# Patient Record
Sex: Female | Born: 1990 | Race: White | Hispanic: No | Marital: Single | Smoking: Never smoker
Health system: Southern US, Community
[De-identification: ages and names within clinical notes are randomized; demographics above are authoritative.]

## PROBLEM LIST (undated history)

## (undated) HISTORY — PX: BREAST ENHANCEMENT SURGERY: SHX7

---
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
## 2008-08-24 IMAGING — CT CT PELVIS W/ CM
1 of 3 series · 14 of 32 positions shown, 19 images · non-contrast
Comparison: none

HISTORY: Abdominal pain, bloating, constipation

[Series 2: abd_pel 5.0 b40f · axial · 0.56mm/px · z∈[-456,-82]mm · 14 of 87 slices shown, 19 images]
[im 6/87  soft-tissue]
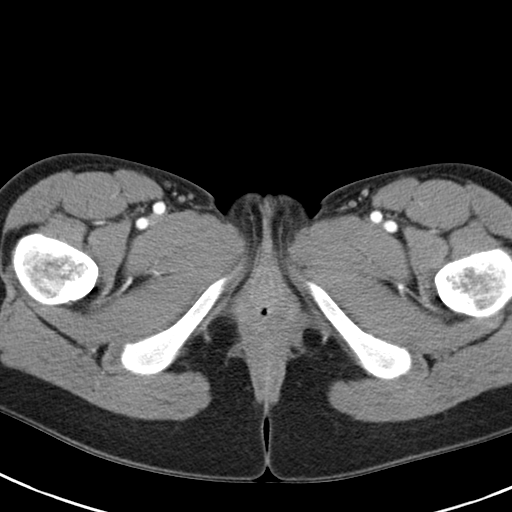
[im 6/87  bone]
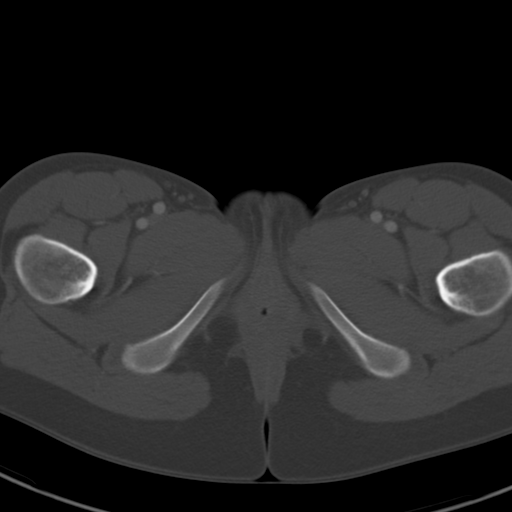
[im 11/87  soft-tissue]
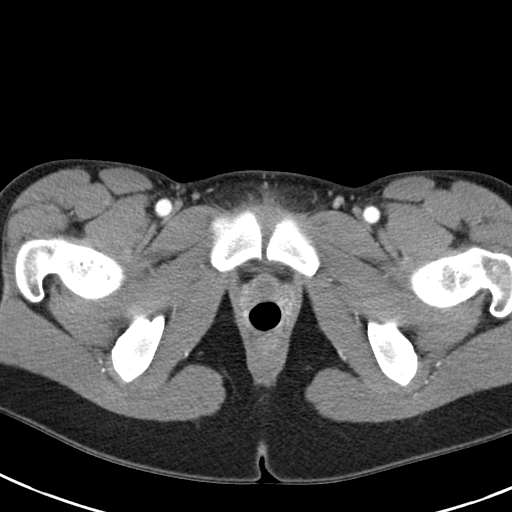
[im 21/87  soft-tissue]
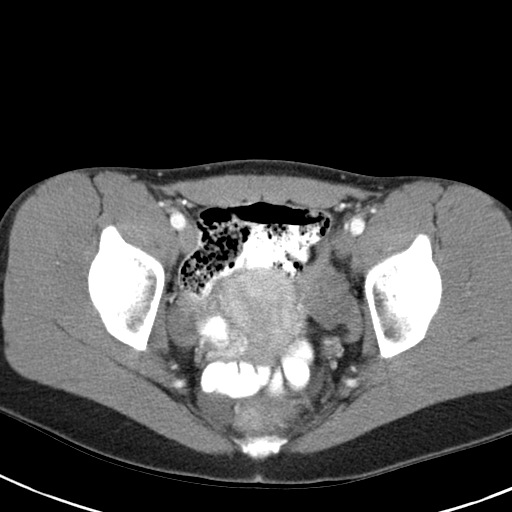
[im 26/87  soft-tissue]
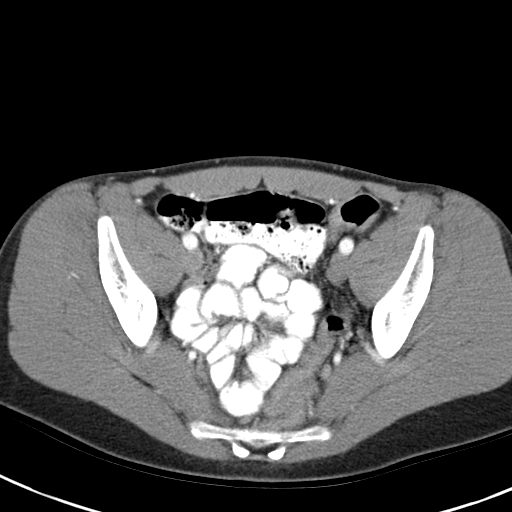
[im 31/87  soft-tissue]
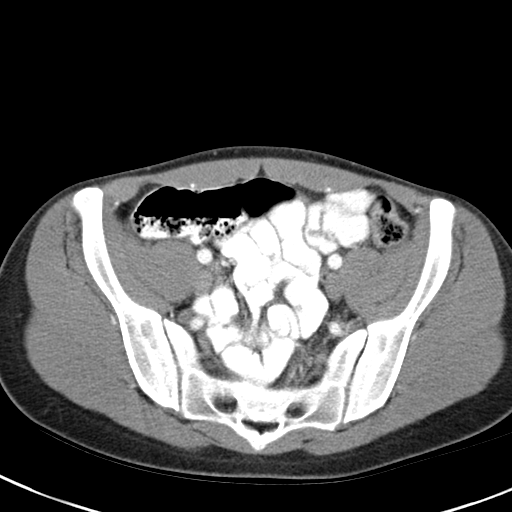
[im 36/87  soft-tissue]
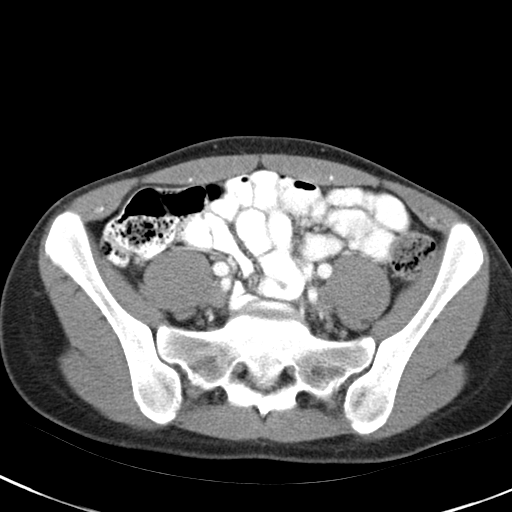
[im 46/87  soft-tissue]
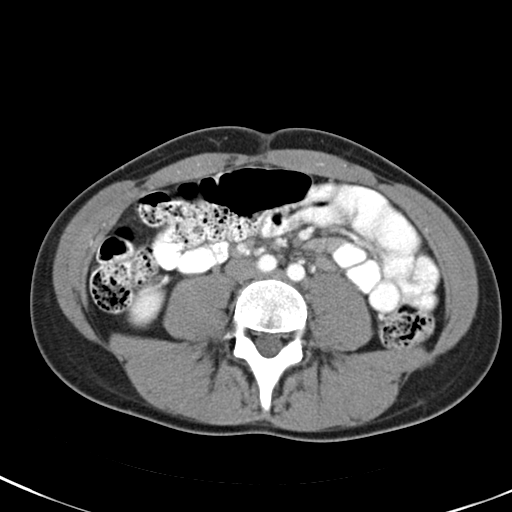
[im 51/87  soft-tissue]
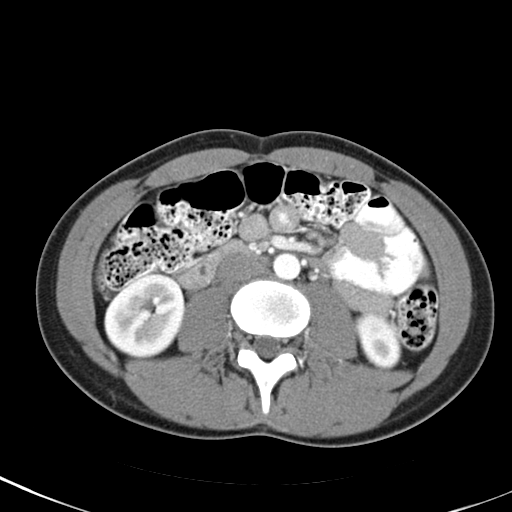
[im 56/87  soft-tissue]
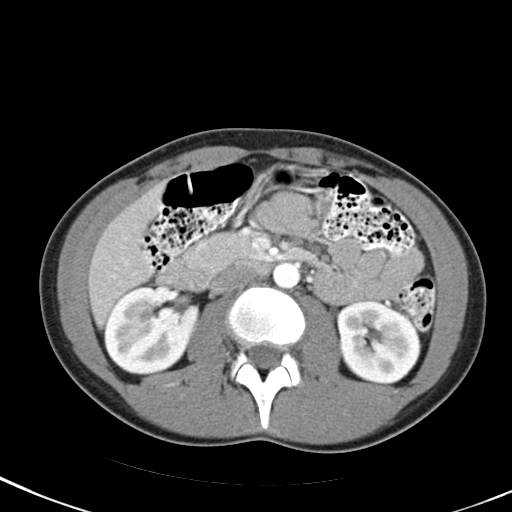
[im 56/87  bone]
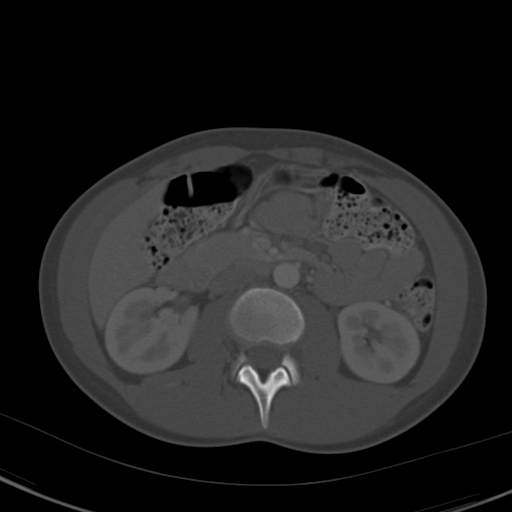
[im 61/87  soft-tissue]
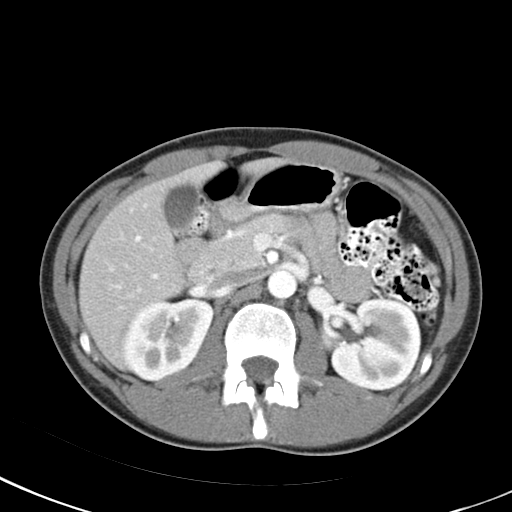
[im 66/87  soft-tissue]
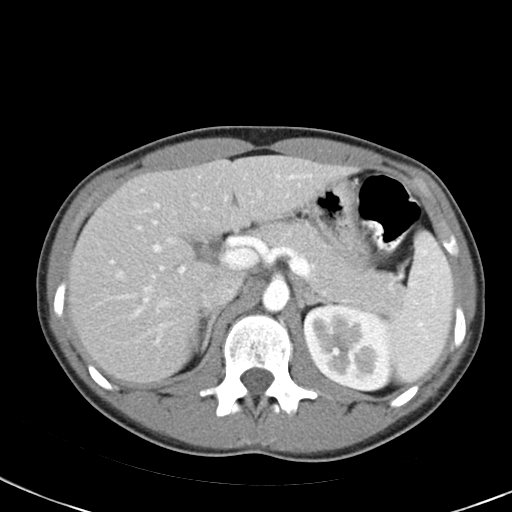
[im 66/87  lung]
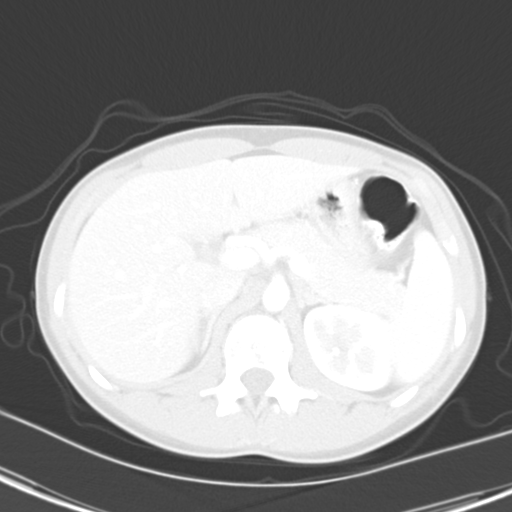
[im 71/87  lung]
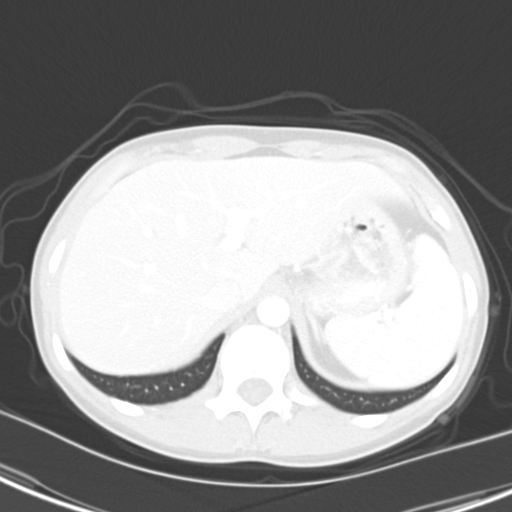
[im 76/87  soft-tissue]
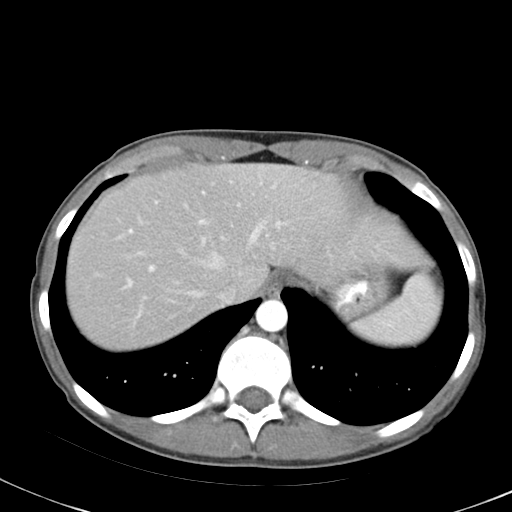
[im 76/87  lung]
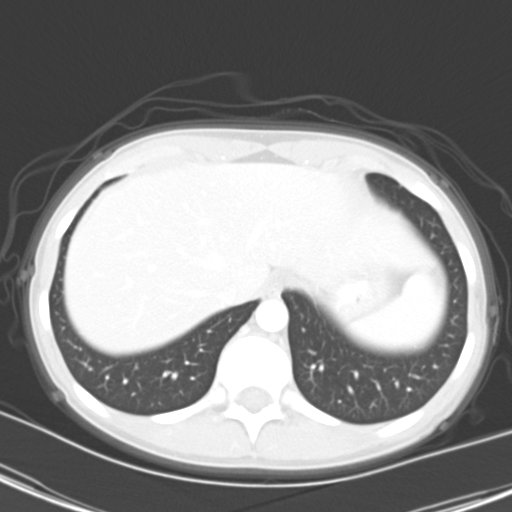
[im 81/87  soft-tissue]
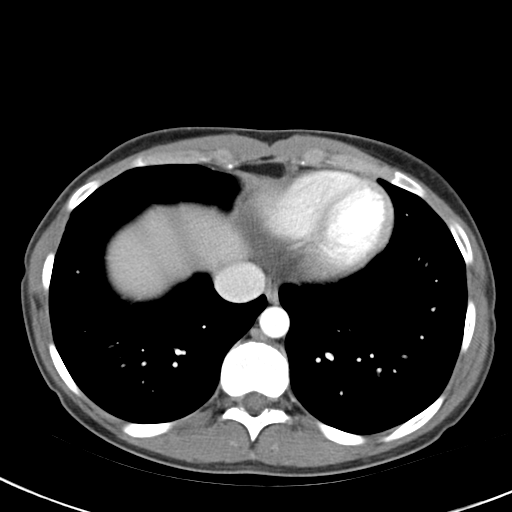
[im 81/87  lung]
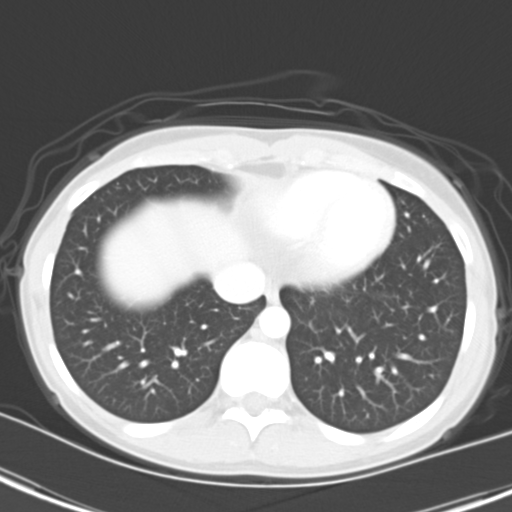

[14 of 32 positions shown; findings below may reference images not displayed]

CT ABDOMEN AND PELVIS WITH CONTRAST:

Multidetector helical CT imaging abdomen and pelvis performed.
Sagittal and coronal images are reconstructed from the axial data set.
Exam utilized dilute oral contrast and 100 cc Imnipaque-G77.
No prior exam for comparison.

CT ABDOMEN:

Lung bases clear.
Liver, spleen, pancreas, kidneys, and adrenal glands normal.
Stomach and upper abdominal bowel loops unremarkable.
No mass, adenopathy, free fluid, or inflammatory process.
IMPRESSION: No acute upper abdominal abnormalities.

CT PELVIS:

Small amount of nonspecific free pelvic fluid.
Tampon in vagina.
Large and small bowel loops normal.
Unremarkable uterus.
Questionable small exophytic cyst the right ovary, 12 mm greatest size image 69.
Bladder and distal ureters unremarkable.
No pelvic mass, adenopathy, or inflammatory process.
Bones unremarkable.
Low-lying cecum, deep in pelvis.
IMPRESSION: Small amount nonspecific free pelvic fluid.
Question tiny 12 mm diameter right ovarian cyst.
Otherwise negative CT pelvis.

## 2009-06-02 ENCOUNTER — Emergency Department (HOSPITAL_COMMUNITY): Admission: EM | Admit: 2009-06-02 | Discharge: 2009-06-02 | Payer: Self-pay | Admitting: Emergency Medicine

## 2010-07-21 NOTE — Assessment & Plan Note (Signed)
Hannah [name], KOOYMAN                  CHART#:  [id number]   DATE:  09/18/2007                       DOB:  [birth year]   CHIEF COMPLAINT:  Abdominal pain, gas pain.   SUBJECTIVE:  The patient is here for a followup visit.  She was last  seen on 07/12/2007.  She has a history of constipation, abdominal  cramping, and bloating, that we initially saw her for back in February  2009.  At that time, she had a CT of the abdomen and pelvis, which  revealed a possible 12-mm right ovarian cyst, otherwise negative.  Labs  revealed normal CBC, CMET, and lipase.  TSH was also normal.  Hemoccults  were negative.  When she saw Dr. Jena Gauss in May, she was doing somewhat  better.  Her constipation seemed to have settled down.  She is having  more regular bowel movements.  It was felt that no further evaluation  was necessary at that time.  She states now, she has had recurrence of  symptoms.  They really have been there the whole time and seemed to be  worse now.  She is particularly complaining of lower abdominal pain,  which she describes as bloating and gas-type pain.  She has a lot of  flatulence.  She is having 3 and 4 stools every morning.  She also has  one, usually after a evening meal.  She really has not noticed any  correlation with any of her dietary intake.  She does consume salads  regularly as well as some cooked vegetables in the evenings.  She does  not really have a lot of fruit.  She has Special K or Fiber One Bars in  the morning for breakfast.  She noticed that the Digestive Advantage  tablets we gave her back in February seemed to make all of these  symptoms worse.  She never tried the HyoMax.  She denies any blood in  the stool or melena.  Denies any nausea, vomiting, heartburn, dysphagia,  or odynophagia.  Her weight is down 3 pounds since we last saw her.   MEDICATIONS:  1. Adderall 10mg  PO daily.  2. Birth Control Pill PO daily.   ALLERGIES:  NKDA   PHYSICAL EXAM:  Wt:  117   Ht: 5'6  Temp: 97.6  BP:  98/72  Pulse:  76  General:  Pleasant, well-nourished, well-developed WF in NAD.  Skin:  Warm, dry.  No jaundice.  HEENT:  Sclera nonicteric.  OP moist, pink.  Chest:  CTA bilaterally.  RRR.  Abd:  Positive bowel sound.  Soft.  Minimal tenderness in the lower  abdomen.  No distention.  No rebound or guarding.  No masses or HSM.  Lower ext:  No edema.   IMPRESSION:  Hannah [name] is an 20 y/o lady with abdominal bloating and lower  abdominal discomfort with change in bowel movements.  She was seen  previously for constipation but now she is having several formed bowel  movements daily.  Her most prominent complaint is of bloating and  discomfort.  I suspect her symptoms are secondary to IBS.  Ddx includes  Celiac disease and IBD.   PLAN:  1. ESR, Celiac Ab Panel.  2. Trial of Levsin 0.125mg  SL qac and qhs prn #90 no refills.  3. IBS literature provided.  4.  Discuss further with Dr. Jena Gauss once labs available.       Hannah [name], P.A.  Electronically Signed     R. Roetta Sessions, M.D.  Electronically Signed    LL/MEDQ  D:  09/18/2007  T:  09/19/2007  Job:  [id number]   cc:   Lorin Picket A. Gerda Diss, MD

## 2010-07-21 NOTE — Assessment & Plan Note (Signed)
Hannah [name], Hannah [name]                  CHART#:  [id number]   DATE:  04/28/2007                       DOB:  [birth year]   CHIEF COMPLAINT:  Constipation.   HISTORY OF PRESENT ILLNESS:  The patient is an 20 year old Caucasian  female who is a self referral today for further evaluation of  constipation, abdominal pain and bloating.  Dr. Lilyan Punt is her  primary care physician.  She states she has had problems off and on for  years with constipation, bloating and abdominal cramping.  Over the last  two or three months he symptoms have been worse.  She complains of  feeling bloated, having abdominal cramps on a daily basis.  She was  started on a stool softener and also MiraLax.  She states her bowel  movements were more frequently three and four times a day.  Stool is  soft.  However, she still feels very uncomfortable and has cramps and  bloating.  She said she does not have a bowel movement if she does not  take these medications.  She denies any blood in the stool or melena.  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  She  consumes quite a bit of dietary fiber and non caffeinated beverages.  She runs 30 to 35 minutes a day.  She has had no recent medication  changes.  She states every day she eats very well, has a great appetite.  Has no nausea or vomiting.  Around noon she eats lunch and by 3 o'clock  she is having abdominal cramping.  Cramping can last the rest of the  day.  She denies any weight loss.   CURRENT MEDICATIONS:  1. Adderall 10 mg daily.  2. MiraLax one scoop daily.  3. Birth control pill daily.   ALLERGIES:  No known drug allergies.   PAST MEDICAL HISTORY:  Attention deficit disorder.   PAST SURGICAL HISTORY:  None.   FAMILY HISTORY:  Negative for chronic GI illnesses, colorectal cancer,  IBD.   SOCIAL HISTORY:  She is single.  She works at [employer] three  hours a day Monday through Friday.  She is an Scientist, research (medical).  She is a nonsmoker.  No alcohol  use.   REVIEW OF SYSTEMS:  See HPI for GI.  Constitutional:  No weight loss.  Cardiopulmonary:  No chest pain, shortness of breath.  Genitourinary:  No dysuria, hematuria.  Menses are regular on birth control pills.   PHYSICAL EXAMINATION:  Weight 121.5.  Height 5 feet 6-1/2 inches.  Temperature 98.6.  Blood pressure 100/80.  Pulse 72.  GENERAL:  Pleasant, thin, Caucasian female in no acute distress.  SKIN:  Warm and dry.  No jaundice.  HEENT:  Sclerae nonicteric.  Oropharyngeal mucosa moist and pink.  No  lesions, erythema or exudate.  No lymphadenopathy or thyromegaly.  CHEST:  Clear to auscultation.  CARDIAC:  Exam reveals regular rate and rhythm.  Normal S1, S2.  No  murmurs, rubs or gallops.  ABDOMEN:  Positive bowel sounds.  Abdomen is flat, nondistended.  She  has tenderness in the epigastrium and down in the left lower quadrant  region to deep palpation.  No rebound or guarding.  No abdominal bruits  or hernias.  No hepatosplenomegaly  or masses.  LOWER EXTREMITIES:  No edema.   IMPRESSION:  The patient is an 20 year old lady with worsening abdominal  cramping, bloating, pain and constipation.  Her bowel movements are more  regular, but she continues to have cramping and bloating.  She has  abdominal pain in the epigastrium and left lower quadrant region.  She  has pain and discomfort daily after lunch.  We might be dealing with  irritable bowel syndrome or functional abdominal pain.  However, would  proceed with further workup given severity of her symptoms.  She is  quite tearful and anxious during the exam today.   PLAN:  1. CT of the abdomen, pelvis with IV and oral contrast.  2. TSH, CBC, CMET, lipase.  3. Hemoccult stool x3.  4. Gas bloat.  She provided.  5. Probiotic Digestive Advantage, 1 tablet daily.  6. A trial of HyoMax sublingual p.o. q.a.c. q.h.s. p.r.n. #60, zero      refills.       Tana Coast, P.A.  Electronically Signed     R. Roetta Sessions, M.D.  Electronically Signed    LL/MEDQ  D:  04/28/2007  T:  04/29/2007  Job:  [id number]   cc:   Lorin Picket A. Gerda Diss, MD

## 2010-07-21 NOTE — Assessment & Plan Note (Signed)
Hannah [name], Hannah [name]                  CHART#:  [id number]   DATE:  07/12/2007                       DOB:  Aug 17, 1990   Patient was a no-show for her 06/09/07 follow-up appointment.  We saw  her as a new patient in February of this year for constipation,  abdominal pain, and bloating.  She was felt to have exacerbation of  irritable bowel syndrome.  CT scan of the abdomen and pelvis  demonstrated no abnormalities.  TSH, CBC, BMET, and lipase came back  negative.  She eventually returned three Hemoccult cards, all of which  came back negative.  We prescribed Hyomax, but she does not recall  getting that prescription.  She did take the probiotic, Digestive  Advantage, for a couple of weeks but could not recall any difference in  her symptoms.  She had symptoms for four months prior to seeing [place] in  February.  Someone told her the stool softener she was taking may make  it more difficult for her to evacuate.  She stopped this, and within 4-5  days, her bowel function has normalized.  She has 1-3 bowel movements  daily and is really not having any difficulties with abdominal bloating  or pain.  She is back to baseline.  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  She is about to finish up her  exams for the year.  She works out and is very active.  She is very  attuned to a healthy diet, given her profession.   CURRENT MEDICATIONS:  1. Adderall 10 mg daily.  2. Birth control pills.   ALLERGIES:  No known drug allergies.   PHYSICAL EXAMINATION:  Appears well.  Weight 120.  Height 5 foot 6-1/2 inches.  Temp 91, BP 118/60, pulse 100.  SKIN:  Warm and dry.  ABDOMEN:  Flat.  Positive bowel sounds.  Soft.  Entirely nontender  without appreciable mass, or organomegaly.   ASSESSMENT:  Recent protracted episode of constipation with likely an  element of intertwined irritable bowel syndrome, now back to her  baseline.  The potential for recurrent symptoms has been  discussed.  I  see no reason to pursue any further evaluation at this time.   RECOMMENDATIONS:  Continue a fiber-fortified diet.  Get regular  exercise.  No further followup scheduled at this point in time.  I told  the patient that I would be on standby if I could help in the future in  any way.       Jonathon Bellows, M.D.  Electronically Signed     RMR/MEDQ  D:  07/12/2007  T:  07/12/2007  Job:  [id number]

## 2012-11-22 ENCOUNTER — Ambulatory Visit (INDEPENDENT_AMBULATORY_CARE_PROVIDER_SITE_OTHER): Payer: BC Managed Care – PPO | Admitting: Family Medicine

## 2012-11-22 ENCOUNTER — Encounter: Payer: Self-pay | Admitting: Family Medicine

## 2012-11-22 VITALS — BP 100/72 | Temp 97.6°F | Ht 66.5 in | Wt 136.0 lb

## 2012-11-22 DIAGNOSIS — K589 Irritable bowel syndrome without diarrhea: Secondary | ICD-10-CM

## 2012-11-22 NOTE — Progress Notes (Signed)
  Subjective:    Patient ID: Hannah [name], female    DOB: 1990/08/17, 22 y.o.   MRN: [id number]  HPIPatient is here for excessive gas and stomach pain.. 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   Strict diet. Just protein, minimal no fiber no carbs  Bad smell odor, very anxious  Feels wants to hide all the time  Hx of ibs, and gets worse, stress is adding to things  Digestive enzymes, vinegar, beano. Started phazyme low body fat,  ca  Review of Systems ROS otherwise negative no vomiting no fever no dysuria    Objective:   Physical Exam Alert no acute distress. Lungs clear. Heart regular rate and rhythm. Abdomen soft good bowel sounds no discrete tenderness.       Assessment & Plan:  Impression excessive gas with underlying IBS and extreme high protein diet. Plan trial of probiotics plus Phazyme with meals. I advised patient this will unfortunately continue as long as she is is on this almost pure protein diet. WSL

## 2012-11-22 NOTE — Patient Instructions (Signed)
Probiotic daily (such as align)  phazyme go ahead and use three times per day

## 2012-11-23 DIAGNOSIS — K589 Irritable bowel syndrome without diarrhea: Secondary | ICD-10-CM | POA: Insufficient documentation

## 2013-03-15 ENCOUNTER — Ambulatory Visit (INDEPENDENT_AMBULATORY_CARE_PROVIDER_SITE_OTHER): Payer: 59 | Admitting: Nurse Practitioner

## 2013-03-15 ENCOUNTER — Encounter: Payer: Self-pay | Admitting: Nurse Practitioner

## 2013-03-15 VITALS — BP 112/68 | Ht 66.5 in | Wt 140.0 lb

## 2013-03-15 DIAGNOSIS — F5104 Psychophysiologic insomnia: Secondary | ICD-10-CM

## 2013-03-15 DIAGNOSIS — G4700 Insomnia, unspecified: Secondary | ICD-10-CM

## 2013-03-15 MED ORDER — RAMELTEON 8 MG PO TABS: 8.0000 mg | ORAL_TABLET | Freq: Every day | ORAL | Status: DC

## 2013-03-15 NOTE — Patient Instructions (Signed)

## 2013-03-17 ENCOUNTER — Encounter: Payer: Self-pay | Admitting: Nurse Practitioner

## 2013-03-17 DIAGNOSIS — F5104 Psychophysiologic insomnia: Secondary | ICD-10-CM | POA: Insufficient documentation

## 2013-03-17 NOTE — Assessment & Plan Note (Signed)
Rozerem 8 mg at bedtime as directed. Reviewed potential adverse effects. DC med and call if any problems. Discussed sleep hygiene. Given handout on insomnia. Recheck in 3 months, sooner if any problems.

## 2013-03-17 NOTE — Progress Notes (Signed)
Subjective:  Presents to discuss her chronic insomnia. Regular exercise. Having stress better. Gets to sleep quickly when she gets home from work. Is currently working and going to school. Denies any significant anxiety symptoms. Has difficulty staying asleep, wakes up an average of 5 times per night. States she will walk around for while drink some water for about 5 minutes and goes back to bed to go to sleep. Does not have her TV on in the room. Does take a 35-40 minute nap while at work during her lunch break. No relief with melatonin or natural supplements. Does not drink any caffeine. Is no longer on her Adderall. Denies any OTC stimulants. Eating healthy diet. Problem has been going on for years.  Objective:   BP 112/68  Ht 5' 6.5" (1.689 m)  Wt 140 lb (63.504 kg)  BMI 22.26 kg/m2 NAD. Alert, oriented. Mildly anxious affect. Lungs clear. Heart regular rate rhythm.  Assessment:Chronic insomnia  Plan: Rozerem 8 mg at bedtime as directed. Reviewed potential adverse effects. DC med and call if any problems. Discussed sleep hygiene. Given handout on insomnia. Recheck in 3 months, sooner if any problems. Defers medication for anxiety, does not see this as a major issue.

## 2013-07-16 ENCOUNTER — Telehealth: Payer: Self-pay | Admitting: Family Medicine

## 2013-07-16 NOTE — Telephone Encounter (Signed)
Yes. I will go ahead and send in Rx for monthly pill. Recommend preventive health physical sometime in the next few months. Call back if any problems with new pill. Use back up method first pack.

## 2013-07-16 NOTE — Telephone Encounter (Signed)
Patient said that she wants to get on birth control again and wants to know if we can call something in or will she need an appointment? She would like to go on the birth control where she has a period every month, because she did not do well on the quarterly pill.   [facility] in [place]

## 2013-07-16 NOTE — Telephone Encounter (Signed)
Patient scheduled PE and would like birth control called in

## 2013-07-17 ENCOUNTER — Other Ambulatory Visit: Payer: Self-pay | Admitting: Nurse Practitioner

## 2013-07-17 MED ORDER — NORETHINDRONE ACET-ETHINYL EST 1.5-30 MG-MCG PO TABS: 1.0000 | ORAL_TABLET | Freq: Every day | ORAL | Status: DC

## 2013-07-31 ENCOUNTER — Encounter: Payer: 59 | Admitting: Nurse Practitioner

## 2014-03-28 ENCOUNTER — Telehealth: Payer: Self-pay | Admitting: Family Medicine

## 2014-03-28 MED ORDER — NORETHINDRONE ACET-ETHINYL EST 1.5-30 MG-MCG PO TABS: 1.0000 | ORAL_TABLET | Freq: Every day | ORAL | Status: DC

## 2014-03-28 NOTE — Telephone Encounter (Signed)
Patient requesting Rx for Norethindrone Acetate-Ethinyl Estradiol (JUNEL,LOESTRIN,MICROGESTIN) 1.5-30 MG-MCG tablet   [facility] [address] [place], [place]

## 2014-03-28 NOTE — Telephone Encounter (Signed)
Rx sent electronically to pharmacy. Patient notified. 

## 2014-03-28 NOTE — Telephone Encounter (Signed)
May have this and 2 refills, follow up ov with Hoyle Sauer in March

## 2014-03-28 NOTE — Telephone Encounter (Signed)
Last seen 03/15/13- over 1 year

## 2014-04-02 ENCOUNTER — Telehealth: Payer: Self-pay | Admitting: Family Medicine

## 2014-04-02 MED ORDER — NORETHINDRONE ACET-ETHINYL EST 1.5-30 MG-MCG PO TABS: 1.0000 | ORAL_TABLET | Freq: Every day | ORAL | Status: DC

## 2014-04-02 NOTE — Telephone Encounter (Signed)
Rx sent electronically to pharmacy. Patient notified. 

## 2014-04-02 NOTE — Telephone Encounter (Signed)
Pt called stating that her birth control meds where sent to the wrong pharmacy. [facility] [place] [facility].

## 2014-04-02 NOTE — Telephone Encounter (Signed)
Patient says her insurance will only cover her Norethindrone Acetate-Ethinyl Estradiol (JUNEL,LOESTRIN,MICROGESTIN) 1.5-30 MG-MCG tablet if it is a 90 day supply.  Can we call this in?  Same pharmacy ([facility])

## 2014-04-03 ENCOUNTER — Other Ambulatory Visit: Payer: Self-pay | Admitting: *Deleted

## 2014-04-03 MED ORDER — NORETHINDRONE ACET-ETHINYL EST 1.5-30 MG-MCG PO TABS: 1.0000 | ORAL_TABLET | Freq: Every day | ORAL | Status: DC

## 2014-04-03 NOTE — Telephone Encounter (Signed)
Done. Fax sent to nurses desk.

## 2014-04-03 NOTE — Telephone Encounter (Signed)
Pt notified on voicemail that 90 day supply sent to pharm.

## 2014-05-06 ENCOUNTER — Encounter: Payer: Self-pay | Admitting: Nurse Practitioner

## 2014-05-06 DIAGNOSIS — Z029 Encounter for administrative examinations, unspecified: Secondary | ICD-10-CM

## 2014-07-31 ENCOUNTER — Telehealth: Payer: Self-pay | Admitting: Family Medicine

## 2014-07-31 NOTE — Telephone Encounter (Signed)
Patient not seen in 18 months

## 2014-07-31 NOTE — Telephone Encounter (Signed)
Pt is needing a refill on her birth control medicine [facility] to [facility] [address] in [place].

## 2014-08-01 ENCOUNTER — Other Ambulatory Visit: Payer: Self-pay

## 2014-08-01 ENCOUNTER — Other Ambulatory Visit: Payer: Self-pay | Admitting: Nurse Practitioner

## 2014-08-01 MED ORDER — NORETHINDRONE ACET-ETHINYL EST 1.5-30 MG-MCG PO TABS: 1.0000 | ORAL_TABLET | Freq: Every day | ORAL | Status: DC

## 2014-08-01 NOTE — Telephone Encounter (Signed)
Done. Patient was notified.

## 2014-08-01 NOTE — Telephone Encounter (Signed)
[facility], but patient wanted it sent to [facility] off of [address] In [place].  She is hoping this can be done today because she has to restart today.

## 2014-11-27 ENCOUNTER — Ambulatory Visit: Payer: Self-pay | Admitting: Nurse Practitioner

## 2014-11-27 DIAGNOSIS — Z029 Encounter for administrative examinations, unspecified: Secondary | ICD-10-CM

## 2014-12-03 ENCOUNTER — Ambulatory Visit (INDEPENDENT_AMBULATORY_CARE_PROVIDER_SITE_OTHER): Payer: BLUE CROSS/BLUE SHIELD | Admitting: Family Medicine

## 2014-12-03 ENCOUNTER — Encounter: Payer: Self-pay | Admitting: Family Medicine

## 2014-12-03 VITALS — BP 90/68 | Ht 66.5 in | Wt 132.4 lb

## 2014-12-03 DIAGNOSIS — F909 Attention-deficit hyperactivity disorder, unspecified type: Secondary | ICD-10-CM

## 2014-12-03 DIAGNOSIS — F988 Other specified behavioral and emotional disorders with onset usually occurring in childhood and adolescence: Secondary | ICD-10-CM

## 2014-12-03 MED ORDER — AMPHETAMINE-DEXTROAMPHET ER 10 MG PO CP24: 10.0000 mg | ORAL_CAPSULE | Freq: Every day | ORAL | Status: DC

## 2014-12-03 MED ORDER — AMPHETAMINE-DEXTROAMPHET ER 10 MG PO CP24
10.0000 mg | ORAL_CAPSULE | Freq: Every day | ORAL | Status: DC
Start: 2014-12-03 — End: 2015-03-13

## 2014-12-03 NOTE — Progress Notes (Signed)
   Subjective:    Patient ID: Hannah [name] Alert, female    DOB: Jun 30, 1990, 24 y.o.   MRN: [id number]  HPI This patient had ADD when she was younger. Was on medication during middle school in early high school. She is try to go without medication. 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 She is interested in trying a medication to see the might help her. In the past she has been on Adderall. PMH ADD otherwise benign   Review of Systems negative for headaches chest pain palpitations or eating disorders    Objective:   Physical Exam   lungs are clear hearts regular pulse normal weight normal       Assessment & Plan:   ADD-start Adderall 10 mg extended release one each morning if not significant response over the course of the next several weeks then the next step would be is bumping up the dose patient should follow-up within 6-8 weeks.

## 2014-12-16 ENCOUNTER — Telehealth: Payer: Self-pay | Admitting: Family Medicine

## 2014-12-16 MED ORDER — NORETHINDRONE ACET-ETHINYL EST 1.5-30 MG-MCG PO TABS: 1.0000 | ORAL_TABLET | Freq: Every day | ORAL | Status: DC

## 2014-12-16 NOTE — Telephone Encounter (Signed)
Left message notifying patient that medication was sent to pharmacy.

## 2014-12-16 NOTE — Telephone Encounter (Signed)
She may have 12 refills

## 2014-12-16 NOTE — Telephone Encounter (Signed)
Norethindrone Acetate-Ethinyl Estradiol (JUNEL,LOESTRIN,MICROGESTIN) 1.5-30 MG-MCG tablet   Pt states she had a PE done at a free clinic in [place] about 3 months ago, she has one week left On this med that they issued her for free because she had no insurance at that time.   Can she get a refill here again without being seen? If she needs to be seen here before she will  Need to be seen before 2 pm any day   [facility]

## 2014-12-27 ENCOUNTER — Telehealth: Payer: Self-pay | Admitting: Family Medicine

## 2014-12-27 NOTE — Telephone Encounter (Signed)
Rx prior auth APPROVED for pt's amphetamine-dextroamphetamine (ADDERALL XR) 10 MG 24 hr capsule, vaild 12/25/14-12/25/15 through [facility], case# [id number] Faxed approval to [facility] & [facility] to notify pt

## 2015-02-03 ENCOUNTER — Telehealth: Payer: Self-pay | Admitting: Family Medicine

## 2015-02-03 ENCOUNTER — Other Ambulatory Visit: Payer: Self-pay | Admitting: *Deleted

## 2015-02-03 MED ORDER — NORETHINDRONE ACET-ETHINYL EST 1.5-30 MG-MCG PO TABS: 1.0000 | ORAL_TABLET | Freq: Every day | ORAL | Status: DC

## 2015-02-03 NOTE — Telephone Encounter (Signed)
Med sent to pharm. Pt notified on vm that med was sent and to call back and schedule a physical for appt.

## 2015-02-03 NOTE — Telephone Encounter (Signed)
Pt is needing a refill on her birth control [facility] [place].

## 2015-02-03 NOTE — Telephone Encounter (Signed)
May have 2 refill needs office visit

## 2015-02-03 NOTE — Telephone Encounter (Signed)
Never seen here for physical. Was told in oct needed office visit

## 2015-02-27 ENCOUNTER — Telehealth: Payer: Self-pay | Admitting: Family Medicine

## 2015-02-27 NOTE — Telephone Encounter (Signed)
Norethindrone Acetate-Ethinyl Estradiol (JUNEL,LOESTRIN,MICROGESTIN) 1.5-30 MG-MCG tablet    Pt was supposed to get a 90 day supply back in Nov, she needs the next two months please  Pharmacy only gave her 1 mo   [facility]

## 2015-02-27 NOTE — Telephone Encounter (Signed)
Left message to return call 

## 2015-02-27 NOTE — Telephone Encounter (Signed)
Patient will contact [employer] and update insurance information.

## 2015-02-27 NOTE — Telephone Encounter (Signed)
Spoke with pharmacist at [facility] who states patient has plenty of refills and they are for 90 day supply but sometimes insurance wont allow 90 day supply and they are currently showing her insurance as terminated and need patient to contact them to get rx filled.

## 2015-03-05 ENCOUNTER — Encounter: Payer: BLUE CROSS/BLUE SHIELD | Admitting: Nurse Practitioner

## 2015-03-11 ENCOUNTER — Telehealth: Payer: Self-pay | Admitting: Family Medicine

## 2015-03-11 MED ORDER — NORETHINDRONE ACET-ETHINYL EST 1.5-30 MG-MCG PO TABS: 1.0000 | ORAL_TABLET | Freq: Every day | ORAL | Status: DC

## 2015-03-11 NOTE — Telephone Encounter (Signed)
Pt is needing a 90 day supply of her birth control medicine called in.    [facility] 

## 2015-03-11 NOTE — Telephone Encounter (Signed)
Rx sent electronically to pharmacy. Patient notified. 

## 2015-03-13 ENCOUNTER — Encounter: Payer: Self-pay | Admitting: Nurse Practitioner

## 2015-03-13 ENCOUNTER — Ambulatory Visit (INDEPENDENT_AMBULATORY_CARE_PROVIDER_SITE_OTHER): Payer: Managed Care, Other (non HMO) | Admitting: Nurse Practitioner

## 2015-03-13 VITALS — BP 110/72 | Ht 66.0 in | Wt 129.8 lb

## 2015-03-13 DIAGNOSIS — F909 Attention-deficit hyperactivity disorder, unspecified type: Secondary | ICD-10-CM

## 2015-03-13 DIAGNOSIS — Z0000 Encounter for general adult medical examination without abnormal findings: Secondary | ICD-10-CM

## 2015-03-13 DIAGNOSIS — Z124 Encounter for screening for malignant neoplasm of cervix: Secondary | ICD-10-CM | POA: Diagnosis not present

## 2015-03-13 DIAGNOSIS — F988 Other specified behavioral and emotional disorders with onset usually occurring in childhood and adolescence: Secondary | ICD-10-CM

## 2015-03-13 MED ORDER — NORETHINDRONE ACET-ETHINYL EST 1.5-30 MG-MCG PO TABS
1.0000 | ORAL_TABLET | Freq: Every day | ORAL | Status: DC
Start: 2015-03-13 — End: 2015-09-25

## 2015-03-13 MED ORDER — LISDEXAMFETAMINE DIMESYLATE 40 MG PO CAPS: 40.0000 mg | ORAL_CAPSULE | ORAL | Status: DC

## 2015-03-17 ENCOUNTER — Encounter: Payer: Self-pay | Admitting: Nurse Practitioner

## 2015-03-17 LAB — PAP IG W/ RFLX HPV ASCU: PAP Smear Comment: 0

## 2015-03-17 NOTE — Progress Notes (Signed)
Subjective:    Patient ID: Hannah [name] Alert, female    DOB: 1990-08-18, 25 y.o.   MRN: [id number]  HPI presents for her wellness exam. Same sexual partner for the past 1 1/2 years. Menses regular with normal flow. Has had STD testing done at the [facility] before she got insurance. Regular dental care. Very active. Works out at gym at least an hour a day 5 days per week. Would like to restart Vyvanse but not at maximum dose.     Review of Systems  Constitutional: Negative for fever, activity change, appetite change and fatigue.  HENT: Negative for dental problem, ear pain, sinus pressure and sore throat.   Eyes: Negative for visual disturbance.  Respiratory: Negative for cough, chest tightness, shortness of breath and wheezing.   Cardiovascular: Negative for chest pain.  Gastrointestinal: Negative for nausea, vomiting, abdominal pain, diarrhea, constipation and abdominal distention.  Genitourinary: Negative for dysuria, urgency, frequency, vaginal discharge, enuresis, difficulty urinating, genital sores, menstrual problem and pelvic pain.  Psychiatric/Behavioral: Positive for decreased concentration. Negative for sleep disturbance.       Objective:   Physical Exam  Constitutional: She is oriented to person, place, and time. She appears well-developed. No distress.  HENT:  Right Ear: External ear normal.  Left Ear: External ear normal.  Mouth/Throat: Oropharynx is clear and moist.  Neck: Normal range of motion. Neck supple. No tracheal deviation present. No thyromegaly present.  Cardiovascular: Normal rate, regular rhythm and normal heart sounds.  Exam reveals no gallop.   No murmur heard. Pulmonary/Chest: Effort normal and breath sounds normal.  Abdominal: Soft. She exhibits no distension. There is no tenderness.  Genitourinary: Vagina normal and uterus normal. No vaginal discharge found.  External GU: no rashes or lesions. Vagina: no discharge. Cervix normal in appearance. No CMT.  Bimanual exam: no tenderness or obvious masses.   Musculoskeletal: She exhibits no edema.  Lymphadenopathy:    She has no cervical adenopathy.  Neurological: She is alert and oriented to person, place, and time.  Skin: Skin is warm and dry. No rash noted.  Psychiatric: She has a normal mood and affect. Her behavior is normal.  Vitals reviewed. Breast exam: dense tissue; mild fine nodularity; no masses; axillae no adenopathy.         Assessment & Plan:   Problem List Items Addressed This Visit      Other   ADD (attention deficit disorder)    Other Visit Diagnoses    Routine general medical examination at a health care facility    -  Primary    Relevant Orders    Pap IG w/ reflex to HPV when [facility]    Screening for cervical cancer        Relevant Orders    Pap IG w/ reflex to HPV when [facility]      Meds ordered this encounter  Medications  . Norethindrone Acetate-Ethinyl Estradiol (JUNEL,LOESTRIN,MICROGESTIN) 1.5-30 MG-MCG tablet    Sig: Take 1 tablet by mouth daily. Start first Sunday after next period begins    Dispense:  3 Package    Refill:  [facility]    Order Specific Question:  Supervising Provider    Answer:  Mikey Kirschner [[id number]]  . DISCONTD: lisdexamfetamine (VYVANSE) 40 MG capsule    Sig: Take 1 capsule (40 mg total) by mouth every morning.    Dispense:  30 capsule    Refill:  0    Order  Specific Question:  Supervising Provider    Answer:  Mikey Kirschner [[id number]]  . DISCONTD: lisdexamfetamine (VYVANSE) 40 MG capsule    Sig: Take 1 capsule (40 mg total) by mouth every morning.    Dispense:  30 capsule    Refill:  0    May fill 30 days from 03/13/15    Order Specific Question:  Supervising Provider    Answer:  Mikey Kirschner [[id number]]  . lisdexamfetamine (VYVANSE) 40 MG capsule    Sig: Take 1 capsule (40 mg total) by mouth every morning.    Dispense:  30 capsule    Refill:  0    May fill 60 days from 03/13/15    Order Specific  Question:  Supervising Provider    Answer:  Mikey Kirschner [[id number]]   Restart Vyvanse at 40 mg.  Return in about 3 months (around 06/11/2015) for ADD recheck. Call back sooner if any problems.

## 2015-03-19 ENCOUNTER — Telehealth: Payer: Self-pay | Admitting: Family Medicine

## 2015-03-19 MED ORDER — AMPHETAMINE-DEXTROAMPHET ER 10 MG PO CP24: 10.0000 mg | ORAL_CAPSULE | Freq: Every day | ORAL | Status: DC

## 2015-03-19 NOTE — Telephone Encounter (Signed)
[facility] 03/19/15 

## 2015-03-19 NOTE — Telephone Encounter (Signed)
Pt called stating that her insurance does not cover the vyvanse that carolyn prescribed her, they will only cover adderall. The pt is wanting to go back on the adderall.     [facility] [employer] 

## 2015-03-19 NOTE — Telephone Encounter (Signed)
Adderall XR 10mg , may have 2 separate 30 day prescriptions. Because she is restarting medication she will need to follow-up within 8 weeks to make sure she is tolerating the medicine and that it is helping and recheck blood pressure. In addition to this patient please bring back Vyvanse prescriptions be shredded

## 2015-03-19 NOTE — Telephone Encounter (Signed)
Called patient and informed her per Dr.Scott Luking-Adderall XR 10mg , may have 2 separate 30 day prescriptions. Because she is restarting medication she will need to follow-up within 8 weeks to make sure she is tolerating the medicine and that it is helping and recheck blood pressure. In addition to this patient please bring back Vyvanse prescriptions be shredded. Patient verbalized understanding.

## 2015-04-24 ENCOUNTER — Ambulatory Visit (INDEPENDENT_AMBULATORY_CARE_PROVIDER_SITE_OTHER): Payer: Managed Care, Other (non HMO) | Admitting: Family Medicine

## 2015-04-24 ENCOUNTER — Encounter: Payer: Self-pay | Admitting: Family Medicine

## 2015-04-24 VITALS — BP 110/70 | Temp 98.5°F | Ht 66.5 in | Wt 132.2 lb

## 2015-04-24 DIAGNOSIS — J020 Streptococcal pharyngitis: Secondary | ICD-10-CM

## 2015-04-24 DIAGNOSIS — J069 Acute upper respiratory infection, unspecified: Secondary | ICD-10-CM | POA: Diagnosis not present

## 2015-04-24 MED ORDER — AZITHROMYCIN 250 MG PO TABS: ORAL_TABLET | ORAL | Status: DC

## 2015-04-24 NOTE — Progress Notes (Signed)
   Subjective:    Patient ID: Hannah [name], female    DOB: 01-22-1991, 25 y.o.   MRN: [id number]  Sore Throat  This is a new problem. Associated symptoms include abdominal pain, coughing, ear pain, headaches and swollen glands. Associated symptoms comments: Runny nose, muscle aches, hot & cold flashes.   Patient states no other concerns this visit.   patient states over the past couple days with a little bit of cough but mainly since sore throat feeling swollen in her throat slight runny nose low-grade fever Review of Systems  HENT: Positive for ear pain.   Respiratory: Positive for cough.   Gastrointestinal: Positive for abdominal pain.  Neurological: Positive for headaches.       Objective:   Physical Exam   throat erythematous neck is supple lungs clear heart regular      Assessment & Plan:   pharyngitis probable strep go ahead and treat with antibiotics warning signs discussed follow-up if problems if persistent illness or other issues follow-up

## 2015-05-20 ENCOUNTER — Telehealth: Payer: Self-pay | Admitting: Family Medicine

## 2015-05-20 NOTE — Telephone Encounter (Signed)
Pt dropped off a physical form for college to be filled out. Form in yellow folder at nurse station.

## 2015-05-21 NOTE — Telephone Encounter (Signed)
done

## 2015-06-26 ENCOUNTER — Telehealth: Payer: Self-pay | Admitting: Family Medicine

## 2015-06-26 NOTE — Telephone Encounter (Signed)
Last ADD 03/13/15

## 2015-06-26 NOTE — Telephone Encounter (Signed)
Pt is requesting a refill on her adderall.  

## 2015-06-26 NOTE — Telephone Encounter (Signed)
Apparently patient forgot to follow-up. May have one prescription but patient must be told she needs to schedule an office visit for future prescriptions.

## 2015-06-27 ENCOUNTER — Other Ambulatory Visit: Payer: Self-pay | Admitting: *Deleted

## 2015-06-27 MED ORDER — AMPHETAMINE-DEXTROAMPHET ER 10 MG PO CP24: 10.0000 mg | ORAL_CAPSULE | Freq: Every day | ORAL | Status: DC

## 2015-06-27 NOTE — Telephone Encounter (Signed)
Pt.notified

## 2015-07-28 ENCOUNTER — Ambulatory Visit: Payer: Managed Care, Other (non HMO) | Admitting: Nurse Practitioner

## 2015-07-28 ENCOUNTER — Telehealth: Payer: Self-pay | Admitting: Family Medicine

## 2015-07-28 NOTE — Telephone Encounter (Signed)
#  1 patient may have one month refill #2 please explained to the patient that this is a class II medication that requires office visit every 3 months for ADD. Sometimes this can be stretched every 4 months depending on the situation. This patient needs an office visit she should schedule and keep it

## 2015-07-28 NOTE — Telephone Encounter (Signed)
Pt would like a refill on her add meds please

## 2015-07-29 MED ORDER — AMPHETAMINE-DEXTROAMPHET ER 10 MG PO CP24: 10.0000 mg | ORAL_CAPSULE | Freq: Every day | ORAL | Status: DC

## 2015-07-29 NOTE — Telephone Encounter (Signed)
Left message on voicemail notifying patient that script is ready for pickup and she needs an office visit for any further refills.

## 2015-08-18 ENCOUNTER — Telehealth: Payer: Self-pay | Admitting: Family Medicine

## 2015-08-18 NOTE — Telephone Encounter (Signed)
Error

## 2015-09-25 ENCOUNTER — Encounter: Payer: Self-pay | Admitting: Family Medicine

## 2015-09-25 ENCOUNTER — Ambulatory Visit (INDEPENDENT_AMBULATORY_CARE_PROVIDER_SITE_OTHER): Payer: 59 | Admitting: Family Medicine

## 2015-09-25 VITALS — BP 110/74 | Ht 66.5 in | Wt 128.0 lb

## 2015-09-25 DIAGNOSIS — F909 Attention-deficit hyperactivity disorder, unspecified type: Secondary | ICD-10-CM

## 2015-09-25 DIAGNOSIS — F988 Other specified behavioral and emotional disorders with onset usually occurring in childhood and adolescence: Secondary | ICD-10-CM

## 2015-09-25 MED ORDER — AMPHETAMINE-DEXTROAMPHET ER 15 MG PO CP24: 15.0000 mg | ORAL_CAPSULE | Freq: Every day | ORAL | Status: DC

## 2015-09-25 MED ORDER — NORETHINDRONE ACET-ETHINYL EST 1.5-30 MG-MCG PO TABS: 1.0000 | ORAL_TABLET | Freq: Every day | ORAL | Status: DC

## 2015-09-25 NOTE — Patient Instructions (Signed)
DASH Eating Plan  DASH stands for "Dietary Approaches to Stop Hypertension." The DASH eating plan is a healthy eating plan that has been shown to reduce high blood pressure (hypertension). Additional health benefits may include reducing the risk of type 2 diabetes mellitus, heart disease, and stroke. The DASH eating plan may also help with weight loss.  WHAT DO I NEED TO KNOW ABOUT THE DASH EATING PLAN?  For the DASH eating plan, you will follow these general guidelines:  · Choose foods with a percent daily value for sodium of less than 5% (as listed on the food label).  · Use salt-free seasonings or herbs instead of table salt or sea salt.  · Check with your health care provider or pharmacist before using salt substitutes.  · Eat lower-sodium products, often labeled as "lower sodium" or "no salt added."  · Eat fresh foods.  · Eat more vegetables, fruits, and low-fat dairy products.  · Choose whole grains. Look for the word "whole" as the first word in the ingredient list.  · Choose fish and skinless chicken or turkey more often than red meat. Limit fish, poultry, and meat to 6 oz (170 g) each day.  · Limit sweets, desserts, sugars, and sugary drinks.  · Choose heart-healthy fats.  · Limit cheese to 1 oz (28 g) per day.  · Eat more home-cooked food and less restaurant, buffet, and fast food.  · Limit fried foods.  · Cook foods using methods other than frying.  · Limit canned vegetables. If you do use them, rinse them well to decrease the sodium.  · When eating at a restaurant, ask that your food be prepared with less salt, or no salt if possible.  WHAT FOODS CAN I EAT?  Seek help from a dietitian for individual calorie needs.  Grains  Whole grain or whole wheat bread. Brown rice. Whole grain or whole wheat pasta. Quinoa, bulgur, and whole grain cereals. Low-sodium cereals. Corn or whole wheat flour tortillas. Whole grain cornbread. Whole grain crackers. Low-sodium crackers.  Vegetables  Fresh or frozen vegetables  (raw, steamed, roasted, or grilled). Low-sodium or reduced-sodium tomato and vegetable juices. Low-sodium or reduced-sodium tomato sauce and paste. Low-sodium or reduced-sodium canned vegetables.   Fruits  All fresh, canned (in natural juice), or frozen fruits.  Meat and Other Protein Products  Ground beef (85% or leaner), grass-fed beef, or beef trimmed of fat. Skinless chicken or turkey. Ground chicken or turkey. Pork trimmed of fat. All fish and seafood. Eggs. Dried beans, peas, or lentils. Unsalted nuts and seeds. Unsalted canned beans.  Dairy  Low-fat dairy products, such as skim or 1% milk, 2% or reduced-fat cheeses, low-fat ricotta or cottage cheese, or plain low-fat yogurt. Low-sodium or reduced-sodium cheeses.  Fats and Oils  Tub margarines without trans fats. Light or reduced-fat mayonnaise and salad dressings (reduced sodium). Avocado. Safflower, olive, or canola oils. Natural peanut or almond butter.  Other  Unsalted popcorn and pretzels.  The items listed above may not be a complete list of recommended foods or beverages. Contact your dietitian for more options.  WHAT FOODS ARE NOT RECOMMENDED?  Grains  White bread. White pasta. White rice. Refined cornbread. Bagels and croissants. Crackers that contain trans fat.  Vegetables  Creamed or fried vegetables. Vegetables in a cheese sauce. Regular canned vegetables. Regular canned tomato sauce and paste. Regular tomato and vegetable juices.  Fruits  Dried fruits. Canned fruit in light or heavy syrup. Fruit juice.  Meat and Other Protein   Products  Fatty cuts of meat. Ribs, chicken wings, bacon, sausage, bologna, salami, chitterlings, fatback, hot dogs, bratwurst, and packaged luncheon meats. Salted nuts and seeds. Canned beans with salt.  Dairy  Whole or 2% milk, cream, half-and-half, and cream cheese. Whole-fat or sweetened yogurt. Full-fat cheeses or blue cheese. Nondairy creamers and whipped toppings. Processed cheese, cheese spreads, or cheese  curds.  Condiments  Onion and garlic salt, seasoned salt, table salt, and sea salt. Canned and packaged gravies. Worcestershire sauce. Tartar sauce. Barbecue sauce. Teriyaki sauce. Soy sauce, including reduced sodium. Steak sauce. Fish sauce. Oyster sauce. Cocktail sauce. Horseradish. Ketchup and mustard. Meat flavorings and tenderizers. Bouillon cubes. Hot sauce. Tabasco sauce. Marinades. Taco seasonings. Relishes.  Fats and Oils  Butter, stick margarine, lard, shortening, ghee, and bacon fat. Coconut, palm kernel, or palm oils. Regular salad dressings.  Other  Pickles and olives. Salted popcorn and pretzels.  The items listed above may not be a complete list of foods and beverages to avoid. Contact your dietitian for more information.  WHERE CAN I FIND MORE INFORMATION?  National Heart, Lung, and Blood Institute: www.nhlbi.nih.gov/health/health-topics/topics/dash/     This information is not intended to replace advice given to you by your health care provider. Make sure you discuss any questions you have with your health care provider.     Document Released: 02/11/2011 Document Revised: 03/15/2014 Document Reviewed: 12/27/2012  Elsevier Interactive Patient Education ©2016 Elsevier Inc.

## 2015-09-25 NOTE — Progress Notes (Signed)
   Subjective:    Patient ID: Hannah [name] Alert, female    DOB: [birth year], 25 y.o.   MRN: [id number]  HPIADD check up. Pt wants to see if she can increase dose to 15mg . Pt will be starting back to school. Pt states she eats well and sleeps well. Pt states no side effects from med.  Patient relates a couple times her blood pressure was borderline she denies headaches or chest pain she does try to eat healthy and she exercises a lot Needs refill on birth control.    Review of Systems  Constitutional: Negative for activity change, appetite change and fatigue.  Gastrointestinal: Negative for abdominal pain.  Neurological: Negative for headaches.  Psychiatric/Behavioral: Negative for behavioral problems.       Objective:   Physical Exam  Constitutional: She appears well-developed and well-nourished.  HENT:  Head: Normocephalic.  Cardiovascular: Normal rate, regular rhythm and normal heart sounds.   No murmur heard. Pulmonary/Chest: Effort normal and breath sounds normal.  Neurological: She is alert.  Skin: Skin is warm and dry.  Psychiatric: She has a normal mood and affect.  Vitals reviewed.         Assessment & Plan:  ADD increase medication as requested she needs to follow her blood pressure if she starts seeing numbers in the 140s over 90s then that would not be good in she will need to notify us and we would have to reduce the medication she will follow-up in approximately 3 months

## 2015-10-02 MED FILL — DEXTROAMP-AMPHET ER 15 MG C: 15 | 30 days supply | Qty: 30 | Fill #0

## 2015-10-02 MED FILL — LARIN 1.5 MG-30 MCG TABLET: 1.5-30 | 84 days supply | Qty: 63 | Fill #0

## 2015-10-28 ENCOUNTER — Ambulatory Visit: Payer: 59 | Admitting: Family Medicine

## 2015-10-29 DIAGNOSIS — B360 Pityriasis versicolor: Secondary | ICD-10-CM | POA: Diagnosis not present

## 2015-10-29 DIAGNOSIS — D2372 Other benign neoplasm of skin of left lower limb, including hip: Secondary | ICD-10-CM | POA: Diagnosis not present

## 2015-10-29 DIAGNOSIS — L700 Acne vulgaris: Secondary | ICD-10-CM | POA: Diagnosis not present

## 2015-10-30 MED FILL — DEXTROAMP-AMPHET ER 15 MG C: 15 | 30 days supply | Qty: 30 | Fill #0

## 2015-11-20 MED FILL — KETOCONAZOLE 2% SHAMPOO: 2 | 30 days supply | Qty: 120 | Fill #0

## 2015-12-10 MED FILL — DEXTROAMP-AMPHET ER 15 MG C: 15 | 30 days supply | Qty: 30 | Fill #0

## 2016-01-08 ENCOUNTER — Telehealth: Payer: Self-pay | Admitting: Family Medicine

## 2016-01-08 ENCOUNTER — Other Ambulatory Visit: Payer: Self-pay | Admitting: *Deleted

## 2016-01-08 MED ORDER — VALACYCLOVIR HCL 1 G PO TABS: ORAL_TABLET | ORAL | 2 refills | Status: DC

## 2016-01-08 MED FILL — valACYclovir HCL 1 GM TABS: 1 | 1 days supply | Qty: 4 | Fill #0

## 2016-01-08 NOTE — Telephone Encounter (Signed)
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

## 2016-01-08 NOTE — Telephone Encounter (Signed)
Valtrex one gram. Take 2 tablets now then 2 tablets 12 hours later 2 refills per dr Richardson Landry. Med sent to pharm. Pt notified.

## 2016-02-04 MED FILL — DOXYCYCLINE HYCLATE 100 MG: 100 | 30 days supply | Qty: 60 | Fill #0

## 2016-03-19 ENCOUNTER — Ambulatory Visit (INDEPENDENT_AMBULATORY_CARE_PROVIDER_SITE_OTHER): Payer: 59 | Admitting: Family Medicine

## 2016-03-19 ENCOUNTER — Encounter: Payer: Self-pay | Admitting: Family Medicine

## 2016-03-19 VITALS — BP 110/70 | Ht 66.5 in | Wt 130.1 lb

## 2016-03-19 DIAGNOSIS — F902 Attention-deficit hyperactivity disorder, combined type: Secondary | ICD-10-CM

## 2016-03-19 MED ORDER — AMPHETAMINE-DEXTROAMPHET ER 20 MG PO CP24: 20.0000 mg | ORAL_CAPSULE | Freq: Every day | ORAL | 0 refills | Status: DC

## 2016-03-19 NOTE — Progress Notes (Signed)
   Subjective:    Patient ID: Hannah [name] Alert, female    DOB: [birth year], 26 y.o.   MRN: [id number]  HPI Patient was seen today for ADD checkup. -weight, vital signs reviewed.  The following items were covered. -Compliance with medication : yes  -Problems with completing tasks:none  - Eating patterns: good  -sleeping: good  -Additional issues or questions: none   Review of Systems  Constitutional: Negative for activity change, appetite change and fatigue.  Gastrointestinal: Negative for abdominal pain.  Neurological: Negative for headaches.  Psychiatric/Behavioral: Negative for behavioral problems.       Objective:   Physical Exam  Constitutional: She appears well-developed and well-nourished.  HENT:  Head: Normocephalic.  Cardiovascular: Normal rate, regular rhythm and normal heart sounds.   No murmur heard. Pulmonary/Chest: Effort normal and breath sounds normal.  Neurological: She is alert.  Skin: Skin is warm and dry.  Psychiatric: She has a normal mood and affect.  Vitals reviewed.    She relates otherwise she is doing well try to take good care of herself keeps up on her femur health checkups     Assessment & Plan:  ADD-she doesn't really feel like the medicine is doing much. We had a long discussion regarding this. We discussed the pros and cons of increasing the medication. Therefore increased to 20 mg extended release she will let us know regarding the how this is doing over the course of next 2-3 weeks if this is doing well we will stick with the current dose  It is not doing enough we will increase the dose and she will bring the previous scripts back and she will follow-up in several months

## 2016-03-22 MED FILL — ADDERALL XR 20 MG CAP SA: 20 | 30 days supply | Qty: 30 | Fill #0

## 2016-04-19 MED FILL — ADDERALL XR 20 MG CAP SA: 20 | 30 days supply | Qty: 30 | Fill #0

## 2016-05-27 MED FILL — ADDERALL XR 20 MG CAP SA: 20 | 30 days supply | Qty: 30 | Fill #0

## 2016-06-24 ENCOUNTER — Other Ambulatory Visit: Payer: Self-pay | Admitting: Family Medicine

## 2016-06-24 ENCOUNTER — Telehealth: Payer: Self-pay | Admitting: Family Medicine

## 2016-06-24 NOTE — Telephone Encounter (Signed)
Requesting refill on amphetamine-dextroamphetamine (ADDERALL XR) 20 MG 24 hr capsule .

## 2016-06-25 NOTE — Telephone Encounter (Signed)
This patient may have a 30 day prescription. She needs to schedule office visit for ADD before this 30 day runs out-in order to get future prescriptions for this-last ADD visit approximately 3 months ago

## 2016-06-25 NOTE — Telephone Encounter (Signed)
Spoke with patient and informed her per Dr.Scott Luking- we will give a 30 day supply but an office visit is needed for ADHD within the next month. Patient verbalized understanding.

## 2016-06-28 ENCOUNTER — Other Ambulatory Visit: Payer: Self-pay | Admitting: *Deleted

## 2016-06-28 MED ORDER — AMPHETAMINE-DEXTROAMPHET ER 20 MG PO CP24: 20.0000 mg | ORAL_CAPSULE | Freq: Every day | ORAL | 0 refills | Status: DC

## 2016-06-30 MED FILL — ADDERALL XR 20 MG CAP SA: 20 | 30 days supply | Qty: 30 | Fill #0

## 2016-07-15 ENCOUNTER — Encounter: Payer: Self-pay | Admitting: Family Medicine

## 2016-07-15 ENCOUNTER — Ambulatory Visit (INDEPENDENT_AMBULATORY_CARE_PROVIDER_SITE_OTHER): Payer: 59 | Admitting: Family Medicine

## 2016-07-15 VITALS — BP 110/74 | Temp 98.4°F | Ht 66.5 in | Wt 132.0 lb

## 2016-07-15 DIAGNOSIS — R300 Dysuria: Secondary | ICD-10-CM

## 2016-07-15 DIAGNOSIS — J069 Acute upper respiratory infection, unspecified: Secondary | ICD-10-CM

## 2016-07-15 LAB — POCT URINALYSIS DIPSTICK
PH UA: 7 (ref 5.0–8.0)
Spec Grav, UA: 1.015 (ref 1.010–1.025)

## 2016-07-15 MED ORDER — CEFPROZIL 500 MG PO TABS: 500.0000 mg | ORAL_TABLET | Freq: Two times a day (BID) | ORAL | 0 refills | Status: DC

## 2016-07-15 NOTE — Progress Notes (Signed)
   Subjective:    Patient ID: Hannah [name], female    DOB: 10-21-1990, 26 y.o.   MRN: [id number]  Urinary Tract Infection   This is a new problem. Episode onset: 3 days. Treatments tried: azo, macrobid, doxy.   Runny nose, headache, sore throat. Started yesterday.  Patient relates that she is taking doxycycline for a couple days she was given this from a relative. She was using it for possible UTI  Review of Systems  Constitutional: Negative for activity change and fever.  HENT: Positive for congestion and rhinorrhea. Negative for ear pain.   Eyes: Negative for discharge.  Respiratory: Positive for cough. Negative for shortness of breath and wheezing.   Cardiovascular: Negative for chest pain.       Objective:   Physical Exam  Constitutional: She appears well-developed.  HENT:  Head: Normocephalic.  Nose: Nose normal.  Mouth/Throat: Oropharynx is clear and moist. No oropharyngeal exudate.  Neck: Neck supple.  Cardiovascular: Normal rate and normal heart sounds.   No murmur heard. Pulmonary/Chest: Effort normal and breath sounds normal. She has no wheezes.  Lymphadenopathy:    She has no cervical adenopathy.  Skin: Skin is warm and dry.  Nursing note and vitals reviewed.   Patient relates she is going to the beach soon      Assessment & Plan:  Viral URI should gradually get better over the next 7 days  UTI antibiotics prescribed warning signs discussed follow-up if problems-partially treated by the patient with doxycycline the past few days

## 2016-07-19 ENCOUNTER — Encounter: Payer: 59 | Admitting: Family Medicine

## 2016-07-26 ENCOUNTER — Encounter: Payer: Self-pay | Admitting: Family Medicine

## 2016-07-26 ENCOUNTER — Ambulatory Visit (INDEPENDENT_AMBULATORY_CARE_PROVIDER_SITE_OTHER): Payer: 59 | Admitting: Family Medicine

## 2016-07-26 VITALS — BP 112/72 | Ht 67.0 in | Wt 135.0 lb

## 2016-07-26 DIAGNOSIS — F902 Attention-deficit hyperactivity disorder, combined type: Secondary | ICD-10-CM | POA: Diagnosis not present

## 2016-07-26 MED ORDER — AMPHETAMINE-DEXTROAMPHET ER 20 MG PO CP24: 20.0000 mg | ORAL_CAPSULE | Freq: Every day | ORAL | 0 refills | Status: DC

## 2016-07-26 NOTE — Progress Notes (Signed)
   Subjective:    Patient ID: Hannah [name] Alert, female    DOB: [birth year], 26 y.o.   MRN: [id number]  HPIADD check up. No problems or concerns. Med working good. Needs refills.   Patient uses her medication everyday she works and on most days she takes it she states it does help focus She states overall she's been feeling good. She denies any particular health issues.   She states the medicine is not causing any side effects  Review of Systems  Constitutional: Negative for activity change, appetite change and fatigue.  Gastrointestinal: Negative for abdominal pain.  Neurological: Negative for headaches.  Psychiatric/Behavioral: Negative for behavioral problems.       Objective:   Physical Exam  Constitutional: She appears well-developed and well-nourished.  HENT:  Head: Normocephalic.  Cardiovascular: Normal rate, regular rhythm and normal heart sounds.   No murmur heard. Pulmonary/Chest: Effort normal and breath sounds normal.  Neurological: She is alert.  Skin: Skin is warm and dry.  Psychiatric: She has a normal mood and affect.  Vitals reviewed.         Assessment & Plan:  This patient has adult ADD. Takes medication responsibly. Medication does help the patient focus in be more functional. Patient relates that they are not abusing the medication or misusing the medication. The patient understands that if they're having any negative side effects such as elevated high blood pressure severe headaches palpitations they would need stop the medication follow-up immediately. They also understand that the prescriptions are to last for 4 months then the patient will need to follow-up before having further prescriptions.  Patient is very reliable four prescriptions were given    Patient is looking into doing additional schooling

## 2016-07-30 MED FILL — ADDERALL XR 20 MG CAP SA: 20 | 30 days supply | Qty: 30 | Fill #0

## 2016-08-30 MED FILL — ADDERALL XR 20 MG CAP SA: 20 | 30 days supply | Qty: 30 | Fill #0

## 2016-10-01 MED FILL — ADDERALL XR 20 MG CAP SA: 20 | 30 days supply | Qty: 30 | Fill #0

## 2016-10-29 MED FILL — ADDERALL XR 20 MG CAP SA: 20 | 30 days supply | Qty: 30 | Fill #0

## 2016-11-18 ENCOUNTER — Ambulatory Visit (INDEPENDENT_AMBULATORY_CARE_PROVIDER_SITE_OTHER): Payer: 59 | Admitting: Family Medicine

## 2016-11-18 ENCOUNTER — Encounter: Payer: Self-pay | Admitting: Family Medicine

## 2016-11-18 VITALS — BP 118/78 | Ht 67.0 in | Wt 134.1 lb

## 2016-11-18 DIAGNOSIS — F902 Attention-deficit hyperactivity disorder, combined type: Secondary | ICD-10-CM

## 2016-11-18 MED ORDER — AMPHETAMINE-DEXTROAMPHET ER 20 MG PO CP24: 20.0000 mg | ORAL_CAPSULE | Freq: Every day | ORAL | 0 refills | Status: DC

## 2016-11-18 NOTE — Progress Notes (Signed)
   Subjective:    Patient ID: Hannah [name], female    DOB: May 21, 1990, 26 y.o.   MRN: [id number]  HPI Patient was seen today for ADD checkup. -weight, vital signs reviewed.  The following items were covered. -Compliance with medication : Yes  -Problems with completing homework, paying attention/taking good notes in school: Not while taking the medication.  -grades: NA  - Eating patterns : Good  -sleeping: Good  -Additional issues or questions: None  Patient does a good job taking a medicine helps her focus at work without it she have a very difficult time. She does not abuse medicine. Review of Systems  Constitutional: Negative for activity change, fatigue and fever.  HENT: Negative for congestion.   Respiratory: Negative for cough, chest tightness and shortness of breath.   Cardiovascular: Negative for chest pain and leg swelling.  Gastrointestinal: Negative for abdominal pain.  Skin: Negative for color change.  Neurological: Negative for headaches.  Psychiatric/Behavioral: Negative for behavioral problems.       Objective:   Physical Exam  Constitutional: She appears well-developed and well-nourished. No distress.  HENT:  Head: Normocephalic and atraumatic.  Eyes: Right eye exhibits no discharge. Left eye exhibits no discharge.  Neck: No tracheal deviation present.  Cardiovascular: Normal rate, regular rhythm and normal heart sounds.   No murmur heard. Pulmonary/Chest: Effort normal and breath sounds normal. No respiratory distress. She has no wheezes. She has no rales.  Musculoskeletal: She exhibits no edema.  Lymphadenopathy:    She has no cervical adenopathy.  Neurological: She is [name]. She exhibits normal muscle tone.  Skin: Skin is warm and dry. No erythema.  Psychiatric: Her behavior is normal.  Vitals reviewed.         Assessment & Plan:  ADD Takes medicine help with focus Does not abuse it Drug registry was checked Prescriptions given  follow-up in approximately 4 months 4 prescriptions given. Patient very reliable

## 2016-12-01 MED FILL — ADDERALL XR 20 MG CAP SA: 20 | 30 days supply | Qty: 30 | Fill #0

## 2016-12-29 MED FILL — ADDERALL XR 20 MG CAP SA: 20 | 30 days supply | Qty: 30 | Fill #0

## 2017-02-02 MED FILL — ADDERALL XR 20 MG CAP SA: 20 | 30 days supply | Qty: 30 | Fill #0

## 2017-03-07 MED FILL — ADDERALL XR 20 MG CAP SA: 20 | 30 days supply | Qty: 30 | Fill #0

## 2017-03-31 ENCOUNTER — Ambulatory Visit: Payer: 59 | Admitting: Family Medicine

## 2017-03-31 VITALS — BP 106/68 | Ht 67.0 in | Wt 131.8 lb

## 2017-03-31 DIAGNOSIS — F902 Attention-deficit hyperactivity disorder, combined type: Secondary | ICD-10-CM

## 2017-03-31 MED ORDER — AMPHETAMINE-DEXTROAMPHET ER 20 MG PO CP24: 20.0000 mg | ORAL_CAPSULE | Freq: Every day | ORAL | 0 refills | Status: DC

## 2017-03-31 NOTE — Progress Notes (Signed)
   Subjective:    Patient ID: Hannah [name], female    DOB: Nov 29, 1990, 27 y.o.   MRN: [id number]  HPI Patient arrives for a follow up on ADHD. Doing well on current med. Doing well on medicine helps her focus.  Helps her pay attention.  Very busy where she works in the [place].  Denies abusing the medicine.  Relates knowledge of the need to take medicine on a regular basis ADD   Review of Systems  Constitutional: Negative for activity change, appetite change and fatigue.  HENT: Negative for congestion.   Respiratory: Negative for cough.   Cardiovascular: Negative for chest pain.  Gastrointestinal: Negative for abdominal pain.  Skin: Negative for color change.  Neurological: Negative for headaches.  Psychiatric/Behavioral: Negative for behavioral problems.       Objective:   Physical Exam  Constitutional: She appears well-developed and well-nourished.  HENT:  Head: Normocephalic and atraumatic.  Eyes: Right eye exhibits no discharge. Left eye exhibits no discharge.  Cardiovascular: Normal rate, regular rhythm and normal heart sounds.  No murmur heard. Pulmonary/Chest: Effort normal and breath sounds normal. No respiratory distress. She has no wheezes. She has no rales.  Neurological: She is [name].  Skin: Skin is warm and dry.  Psychiatric: She has a normal mood and affect.  Vitals reviewed.         Assessment & Plan:  Stable 4 prescriptions given Follow-up 4 months Female health checkups recommended on a regular basis

## 2017-04-07 MED FILL — ADDERALL XR 20 MG CAP SA: 20 | 30 days supply | Qty: 30 | Fill #0

## 2017-05-06 MED FILL — ADDERALL XR 20 MG CAP SA: 20 | 30 days supply | Qty: 30 | Fill #0

## 2017-06-06 MED FILL — ADDERALL XR 20 MG CAP SA: 20 | 30 days supply | Qty: 30 | Fill #0

## 2017-06-30 ENCOUNTER — Telehealth: Payer: Self-pay | Admitting: Family Medicine

## 2017-06-30 NOTE — Telephone Encounter (Signed)
Please advise 

## 2017-06-30 NOTE — Telephone Encounter (Signed)
Pt called stating that she lost her last prescription for her adderall. Pt is wanting to know what can be done. Please advise.

## 2017-07-01 MED ORDER — AMPHETAMINE-DEXTROAMPHET ER 20 MG PO CP24: 20.0000 mg | ORAL_CAPSULE | Freq: Every day | ORAL | 0 refills | Status: DC

## 2017-07-01 NOTE — Telephone Encounter (Signed)
She may go ahead and have a 30-day prescription for her ADD medicine.  Fill date would be Jul 06, 2017 recommend follow-up office visit toward the end of May

## 2017-07-01 NOTE — Telephone Encounter (Signed)
I printed awaiting Dr.Signature.

## 2017-07-01 NOTE — Telephone Encounter (Signed)
I called to inform that the rx has been written and is up front for pick up. I asked that she call us back so we can notify her.

## 2017-07-04 NOTE — Telephone Encounter (Signed)
Left message to return call 

## 2017-07-07 MED FILL — ADDERALL XR 20 MG CAP SA: 20 | 30 days supply | Qty: 30 | Fill #0

## 2017-07-07 NOTE — Telephone Encounter (Signed)
Left message to return call 

## 2017-07-07 NOTE — Telephone Encounter (Signed)
Patient picked up and signed for prescription per front office.

## 2017-07-12 ENCOUNTER — Ambulatory Visit: Payer: 59 | Admitting: Family Medicine

## 2017-07-27 ENCOUNTER — Encounter: Payer: Self-pay | Admitting: Family Medicine

## 2017-07-27 ENCOUNTER — Ambulatory Visit (INDEPENDENT_AMBULATORY_CARE_PROVIDER_SITE_OTHER): Payer: 59 | Admitting: Family Medicine

## 2017-07-27 VITALS — BP 110/64 | Ht 67.0 in | Wt 131.6 lb

## 2017-07-27 DIAGNOSIS — F902 Attention-deficit hyperactivity disorder, combined type: Secondary | ICD-10-CM

## 2017-07-27 MED ORDER — AMPHETAMINE-DEXTROAMPHET ER 20 MG PO CP24: 20.0000 mg | ORAL_CAPSULE | Freq: Every day | ORAL | 0 refills | Status: DC

## 2017-07-27 NOTE — Progress Notes (Signed)
   Subjective:    Patient ID: Hannah [name], female    DOB: 1990-08-04, 27 y.o.   MRN: [id number]  HPIADD check up. Takes adderall xr 20mg  daily. Med working good. No concerns just needs refills.  Patient works in the emergency department She takes this medication every morning It does help her focus and stay on track She she denies abusing the medicine Without the medicine she has difficult time staying focused and it affects her job and life performance PMH ADD   Review of Systems  Constitutional: Negative for activity change, appetite change and fatigue.  HENT: Negative for congestion.   Respiratory: Negative for cough.   Cardiovascular: Negative for chest pain.  Gastrointestinal: Negative for abdominal pain.  Skin: Negative for color change.  Neurological: Negative for headaches.  Psychiatric/Behavioral: Negative for behavioral problems.       Objective:   Physical Exam  Constitutional: She appears well-developed and well-nourished.  HENT:  Head: Normocephalic.  Cardiovascular: Normal rate, regular rhythm and normal heart sounds.  No murmur heard. Pulmonary/Chest: Effort normal and breath sounds normal.  Neurological: She is [name].  Skin: Skin is warm and dry.  Psychiatric: She has a normal mood and affect.  Vitals reviewed.         Assessment & Plan:  Adult ADD Doing well on medication It does benefit her 4 prescriptions given Follow-up in 4 months She does do wellness

## 2017-08-10 MED FILL — ADDERALL XR 20 MG CAP SA: 20 | 30 days supply | Qty: 30 | Fill #0

## 2017-08-29 ENCOUNTER — Telehealth: Payer: Self-pay | Admitting: Family Medicine

## 2017-08-29 NOTE — Telephone Encounter (Signed)
Left message to return call 

## 2017-08-29 NOTE — Telephone Encounter (Signed)
Patient notified per Carolyn-No specific labs needed based on her age and health history unless she needs some done for work. Patient verbalized understanding. Patient stated she just needs Wellness for work.

## 2017-08-29 NOTE — Telephone Encounter (Signed)
No specific labs based on her age and health history unless she needs some done for work.

## 2017-08-29 NOTE — Telephone Encounter (Signed)
Patient has physical schedule and needing labs done.

## 2017-09-07 MED FILL — ADDERALL XR 20 MG CAP SA: 20 | 30 days supply | Qty: 30 | Fill #0

## 2017-09-14 ENCOUNTER — Ambulatory Visit (INDEPENDENT_AMBULATORY_CARE_PROVIDER_SITE_OTHER): Payer: 59 | Admitting: Nurse Practitioner

## 2017-09-14 ENCOUNTER — Encounter: Payer: Self-pay | Admitting: Nurse Practitioner

## 2017-09-14 VITALS — BP 108/72 | Ht 67.0 in | Wt 129.2 lb

## 2017-09-14 DIAGNOSIS — R102 Pelvic and perineal pain: Secondary | ICD-10-CM | POA: Diagnosis not present

## 2017-09-14 DIAGNOSIS — Z01419 Encounter for gynecological examination (general) (routine) without abnormal findings: Secondary | ICD-10-CM | POA: Diagnosis not present

## 2017-09-14 DIAGNOSIS — Z124 Encounter for screening for malignant neoplasm of cervix: Secondary | ICD-10-CM

## 2017-09-14 DIAGNOSIS — Z0000 Encounter for general adult medical examination without abnormal findings: Secondary | ICD-10-CM

## 2017-09-14 LAB — POCT URINE PREGNANCY: PREG TEST UR: NEGATIVE

## 2017-09-16 ENCOUNTER — Encounter: Payer: Self-pay | Admitting: Nurse Practitioner

## 2017-09-16 LAB — PAP IG, CT-NG, RFX HPV ASCU
CHLAMYDIA, NUC. ACID AMP: NEGATIVE
Gonococcus by Nucleic Acid Amp: NEGATIVE
PAP Smear Comment: 0

## 2017-09-16 NOTE — Progress Notes (Signed)
   Subjective:    Patient ID: Hannah [name], female    DOB: 03-24-1990, 27 y.o.   MRN: [id number]  HPI presents for her wellness exam. Same sexual partner. Has been off contraceptives for about 2 years. Regular cycles lasting about 3-4 days. Had a cycle last month. Actively trying to conceive. Thinks her partner has issues due to chronic use of steroids.  Weight stable. Working out on a regular basis. Regular vision. Needs dental exam.     Review of Systems  Constitutional: Negative for activity change, appetite change and fatigue.  HENT: Negative for dental problem, ear pain, sinus pressure and sore throat.   Respiratory: Negative for cough, chest tightness, shortness of breath and wheezing.   Cardiovascular: Negative for chest pain.  Gastrointestinal: Negative for abdominal distention, abdominal pain, constipation, diarrhea, nausea and vomiting.  Genitourinary: Positive for pelvic pain. Negative for difficulty urinating, dysuria, frequency, genital sores, menstrual problem, urgency and vaginal discharge.       Occasional brief mild pelvic pain usually mid cycle. Mostly on the left side but can occur on the right. She states she has a history of ovarian cysts. About mid cycle.        Objective:   Physical Exam  Constitutional: She is oriented to person, place, and time. She appears well-developed. No distress.  HENT:  Right Ear: External ear normal.  Left Ear: External ear normal.  Mouth/Throat: Oropharynx is clear and moist.  Neck: Normal range of motion. Neck supple. No tracheal deviation present. No thyromegaly present.  Cardiovascular: Normal rate, regular rhythm and normal heart sounds. Exam reveals no gallop.  No murmur heard. Pulmonary/Chest: Effort normal and breath sounds normal. Right breast exhibits no inverted nipple, no mass, no skin change and no tenderness. Left breast exhibits no inverted nipple, no mass, no skin change and no tenderness. Breasts are symmetrical.    Axillae no adenopathy.   Abdominal: Soft. She exhibits no distension. There is no tenderness.  Genitourinary: Vagina normal and uterus normal. No vaginal discharge found.  Genitourinary Comments: External GU: No rashes or lesions.  Vagina no discharge.  Cervix normal limit in appearance.  No CMT.  Bimanual exam no obvious masses, mild tenderness near right adnexal area.  Musculoskeletal: She exhibits no edema.  Lymphadenopathy:    She has no cervical adenopathy.  Neurological: She is [name] and oriented to person, place, and time.  Skin: Skin is warm and dry. No rash noted.  Psychiatric: She has a normal mood and affect. Her behavior is normal.   Results for orders placed or performed in visit on 09/14/17  POCT urine pregnancy  Result Value Ref Range   Preg Test, Ur Negative Negative                Assessment & Plan:  Well woman exam - Plan: Pap IG, CT/NG w/ reflex HPV when ASC-U  Screening for cervical cancer - Plan: Pap IG, CT/NG w/ reflex HPV when ASC-U  Pelvic pain in female - Plan: Pap IG, CT/NG w/ reflex HPV when ASC-U, POCT urine pregnancy  Pelvic discomfort most likely related to midcycle ovulation.  Defers referral for fertility evaluation at this time.  Continue daily multivitamin healthy diet and regular exercise. Return in about 1 year (around 09/15/2018) for physical.

## 2017-10-06 MED FILL — ADDERALL XR 20 MG CAP SA: 20 | 30 days supply | Qty: 30 | Fill #0

## 2017-11-08 MED FILL — ADDERALL XR 20 MG CAP SA: 20 | 30 days supply | Qty: 30 | Fill #0

## 2017-12-27 ENCOUNTER — Ambulatory Visit: Payer: 59 | Admitting: Family Medicine

## 2017-12-27 ENCOUNTER — Encounter: Payer: Self-pay | Admitting: Family Medicine

## 2017-12-27 VITALS — BP 110/72 | Ht 67.0 in | Wt 131.0 lb

## 2017-12-27 DIAGNOSIS — M7989 Other specified soft tissue disorders: Secondary | ICD-10-CM

## 2017-12-27 DIAGNOSIS — F902 Attention-deficit hyperactivity disorder, combined type: Secondary | ICD-10-CM

## 2017-12-27 MED ORDER — AMPHETAMINE-DEXTROAMPHET ER 20 MG PO CP24: 20.0000 mg | ORAL_CAPSULE | Freq: Every day | ORAL | 0 refills | Status: DC

## 2017-12-27 MED ORDER — AMPHETAMINE-DEXTROAMPHET ER 20 MG PO CP24
20.0000 mg | ORAL_CAPSULE | Freq: Every day | ORAL | 0 refills | Status: DC
Start: 2018-02-25 — End: 2018-02-23

## 2017-12-27 MED FILL — ADDERALL XR 20 MG CAP SA: 20 | 30 days supply | Qty: 30 | Fill #0

## 2017-12-27 NOTE — Progress Notes (Signed)
   Subjective:    Patient ID: Hannah [name], female    DOB: 23-Aug-1990, 27 y.o.   MRN: [id number]  HPI  Patient was seen today for ADD checkup.  This patient does have ADD.  Patient takes medications for this.  If this does help control overall symptoms.  Please see below.  -weight, vital signs reviewed.  The following items were covered. -Compliance with medication : Yes, takes everyday typically, helps her manage distractions at work when she takes it.  -Problems with completing homework, paying attention/taking good notes in school: No  -grades: Na  -Eating patterns : good  -sleeping: good  -Additional issues or questions: cyst under arm pit on the right side x 2 days, not painful, no drainage; no recent illness or fevers; reports hx of cysts with cyst to right under arm for several years that is soft and has not changed. Also with swollen submental lymph node that has not changed as well.    Review of Systems  Constitutional: Negative for activity change, appetite change, fever and unexpected weight change.  Respiratory: Negative for shortness of breath.   Cardiovascular: Negative for chest pain and palpitations.       Objective:   Physical Exam  Constitutional: She is oriented to person, place, and time. She appears well-developed and well-nourished. No distress.  HENT:  Head: Normocephalic and atraumatic.  Neck: Neck supple.  Cardiovascular: Normal rate, regular rhythm and normal heart sounds.  No murmur heard. Pulmonary/Chest: Effort normal and breath sounds normal. No respiratory distress.  Musculoskeletal:  Stable small cyst palpated to right axilla, soft, mobile, non-tender. New cyst palpated to anterior right axilla, small, soft, mobile, non-tender. Feels deeper than a subcutaneous cyst.  Lymphadenopathy:       Head (right side): Submental (small, non-tender, non-fixed node present, stable per patient) adenopathy present.    She has no cervical adenopathy.    Neurological: She is [name] and oriented to person, place, and time.  Skin: Skin is warm and dry.  Psychiatric: She has a normal mood and affect.  Nursing note and vitals reviewed.     Assessment & Plan:  1. Attention deficit hyperactivity disorder (ADHD), combined type  The patient was seen today as part of the visit regarding ADD. Medications were reviewed with the patient as well as compliance. Side effects were checked for. Discussion regarding effectiveness was held. Prescriptions were written. Patient reminded to follow-up in approximately 3 months. Behavioral issues were addressed.  Complied with [facility] law, drug registry was checked and verified while present with the patient.  Doing well on current medication, will send in 3 refills, f/u in 3 months.  2. Cyst of soft tissue  Will need further evaluation of the cyst with MRI. Will discuss with radiologist further to determine appropriate order and notify patient.   As attending physician to this patient visit, this patient was seen in conjunction with the nurse practitioner.  The history,physical and treatment plan was reviewed with the nurse practitioner and pertinent findings were verified with the patient.  Also the treatment plan was reviewed with the patient while they were present. WSLMD  Greater than 50% of this 25 minute face to face visit was spent in counseling and discussion and coordination of care regarding the above diagnosis/diagnosies

## 2018-01-19 ENCOUNTER — Telehealth: Payer: Self-pay | Admitting: Family Medicine

## 2018-01-19 NOTE — Telephone Encounter (Signed)
Please call patient and let her know that we have discussed her case with radiology.  Radiologist recommends MRI of the right shoulder (indicate that the concern is for soft tissue muscle bulging, and reason for exam is soft tissue mass). Please order and schedule pt and let her know. Thanks!

## 2018-01-19 NOTE — Telephone Encounter (Signed)
Left message to return call 

## 2018-01-24 ENCOUNTER — Other Ambulatory Visit: Payer: Self-pay | Admitting: Family Medicine

## 2018-01-24 DIAGNOSIS — M7989 Other specified soft tissue disorders: Secondary | ICD-10-CM

## 2018-01-24 NOTE — Telephone Encounter (Signed)
MRI order placed. Left message to return call 

## 2018-01-25 ENCOUNTER — Telehealth: Payer: Self-pay | Admitting: Family Medicine

## 2018-01-25 NOTE — Telephone Encounter (Signed)
Last seen 12/27/17 for ADD. Please advise. Thank you

## 2018-01-25 NOTE — Telephone Encounter (Signed)
Called to notify patient of MRI appt, pt states she does not feel a MRI is necessary  Will cancel appt

## 2018-01-25 NOTE — Telephone Encounter (Signed)
Left message to return call 

## 2018-01-25 NOTE — Telephone Encounter (Signed)
Patient is aware 

## 2018-01-25 NOTE — Telephone Encounter (Signed)
So all 3 scripts were sent in by [place] on her last visit here The pharmacy should have these on file I recommend the patient call the pharmacy and talk with someone there and they can search there files in should be able to find the prescription If they search for it and cannot find it let us know then we can resend

## 2018-01-25 NOTE — Telephone Encounter (Signed)
Patient is requesting refill on adderall  XR 20 mg 24 hr capsules called into [place] out-patient pharmacy.

## 2018-01-27 MED FILL — ADDERALL XR 20 MG CAP SA: 20 | 30 days supply | Qty: 30 | Fill #0

## 2018-01-27 NOTE — Telephone Encounter (Signed)
Left message to return call 

## 2018-01-27 NOTE — Telephone Encounter (Signed)
Spoke to patient who states she does not want to proceed with the MRI

## 2018-01-30 NOTE — Telephone Encounter (Signed)
Please cancel the order. Let patient know to monitor those areas and if she notices any change or enlargement over the next 6-8 weeks then she should f/u for further discussion of this, or if she develops any sickness or fever symptoms. Thank you

## 2018-01-31 ENCOUNTER — Ambulatory Visit (HOSPITAL_COMMUNITY): Payer: Self-pay

## 2018-01-31 NOTE — Telephone Encounter (Signed)
Left message to return call 

## 2018-02-14 NOTE — Telephone Encounter (Signed)
Patient advised that Mendel Ryder recommends to monitor those areas and if she notices any change or enlargement over the next 6-8 weeks then she should f/u for further discussion of this, or if she develops any sickness or fever symptoms. Patient verbalized understanding and stated one of the areas had actually shrunk.

## 2018-02-23 ENCOUNTER — Ambulatory Visit: Payer: 59 | Admitting: Family Medicine

## 2018-02-23 ENCOUNTER — Encounter: Payer: Self-pay | Admitting: Family Medicine

## 2018-02-23 VITALS — BP 112/76 | Temp 98.0°F | Ht 67.0 in | Wt 130.0 lb

## 2018-02-23 DIAGNOSIS — J019 Acute sinusitis, unspecified: Secondary | ICD-10-CM

## 2018-02-23 DIAGNOSIS — J029 Acute pharyngitis, unspecified: Secondary | ICD-10-CM

## 2018-02-23 LAB — POCT RAPID STREP A (OFFICE): Rapid Strep A Screen: NEGATIVE

## 2018-02-23 MED ORDER — CEFPROZIL 500 MG PO TABS: 500.0000 mg | ORAL_TABLET | Freq: Two times a day (BID) | ORAL | 0 refills | Status: AC

## 2018-02-23 NOTE — Progress Notes (Signed)
   Subjective:    Patient ID: Hannah [name], female    DOB: [birth year], 27 y.o.   MRN: [id number]  HPI Patient is here today with complaints of general weakness,body aches,sore throat. Reports she thinks she may have had a fever yesterday as could not get warm and she had chills.  Started with dry throat x 4-5 days ago, throat kept getting more dry and scratchy. Woke up yesterday with body aches, chills/sweats, sore throat and cough productive of yellow mucous. Appetite and energy levels decreased.   Taking otc generic delsym DM q 12 h and ibuprofen. At night takes nyquil to help with sleep. Has not take ibuprofen today.   Review of Systems  Constitutional: Positive for activity change, appetite change, chills and fever.  HENT: Positive for congestion, postnasal drip, rhinorrhea and sore throat. Negative for ear pain.   Respiratory: Positive for cough. Negative for shortness of breath and wheezing.   Musculoskeletal: Positive for myalgias.  Neurological: Negative for headaches.         Objective:   Physical Exam Vitals signs and nursing note reviewed.  Constitutional:      General: She is not in acute distress.    Appearance: Normal appearance. She is not toxic-appearing.  HENT:     Head: Normocephalic and atraumatic.     Right Ear: Tympanic membrane is scarred. Tympanic membrane is not erythematous.     Left Ear: Tympanic membrane is scarred. Tympanic membrane is not erythematous.     Nose: Congestion present. No nasal tenderness.     Mouth/Throat:     Mouth: Mucous membranes are moist.     Pharynx: Posterior oropharyngeal erythema (mild) present.  Eyes:     General:        Right eye: No discharge.        Left eye: No discharge.  Neck:     Musculoskeletal: Neck supple.  Cardiovascular:     Rate and Rhythm: Normal rate and regular rhythm.     Heart sounds: Normal heart sounds.  Pulmonary:     Effort: Pulmonary effort is normal. No respiratory distress.     Breath  sounds: Normal breath sounds. No wheezing, rhonchi or rales.  Lymphadenopathy:     Cervical: No cervical adenopathy.  Skin:    General: Skin is warm and dry.  Neurological:     Mental Status: She is [name] and oriented to person, place, and time.  Psychiatric:        Mood and Affect: Mood normal.     Results for orders placed or performed in visit on 02/23/18  POCT rapid strep A  Result Value Ref Range   Rapid Strep A Screen Negative Negative       Assessment & Plan:  Acute rhinosinusitis  Sore throat - Plan: POCT rapid strep A, Strep A DNA probe  Discussed likely post-viral etiology, do not feel this is the flu. Recommend symptomatic care and treatment with abx as prescribed. Warning signs discussed, f/u if symptoms worsen or fail to improve.  Dr. Mickie Hillier was consulted on this case and is in agreement with the above treatment plan.

## 2018-02-24 LAB — STREP A DNA PROBE: Strep Gp A Direct, DNA Probe: NEGATIVE

## 2018-02-27 MED FILL — ADDERALL XR 20 MG CAP SA: 20 | 30 days supply | Qty: 30 | Fill #0

## 2018-03-02 ENCOUNTER — Other Ambulatory Visit: Payer: Self-pay | Admitting: *Deleted

## 2018-03-02 ENCOUNTER — Telehealth: Payer: Self-pay | Admitting: Family Medicine

## 2018-03-02 MED ORDER — FLUCONAZOLE 150 MG PO TABS: ORAL_TABLET | ORAL | 0 refills | Status: DC

## 2018-03-02 NOTE — Telephone Encounter (Signed)
Pt seen recently, finished antibiotic, now has yeast infection that OTC's are not helping to clear up  Can we call in something?  Please advise & call pt    [facility] [address]

## 2018-03-02 NOTE — Telephone Encounter (Signed)
Having yeast infection symptoms. Diflucan 150mg  #2 one po 3 days apart per dr Nicki Reaper. Med sent to pharm and pt notified med sent and to follow up with office visit if symptoms do not clear up or if worse.

## 2018-03-31 ENCOUNTER — Other Ambulatory Visit: Payer: Self-pay | Admitting: Family Medicine

## 2018-03-31 MED FILL — ADDERALL XR 20 MG CAP SA: 20 | 30 days supply | Qty: 30 | Fill #0

## 2018-03-31 NOTE — Telephone Encounter (Signed)
Pharmacy calling checking on status of refill.

## 2018-03-31 NOTE — Telephone Encounter (Signed)
One mo worth 

## 2018-03-31 NOTE — Telephone Encounter (Signed)
Awaiting signature.

## 2018-04-28 ENCOUNTER — Telehealth: Payer: Self-pay | Admitting: Family Medicine

## 2018-04-28 ENCOUNTER — Other Ambulatory Visit: Payer: Self-pay | Admitting: Family Medicine

## 2018-04-28 MED ORDER — AMPHETAMINE-DEXTROAMPHET ER 20 MG PO CP24: ORAL_CAPSULE | ORAL | 0 refills | Status: DC

## 2018-04-28 NOTE — Telephone Encounter (Signed)
Please let patient know that I sent in 1 month of Adderall XR to her pharmacy. She needs to keep her appointment on 3/10 for further refills. Thanks!

## 2018-04-28 NOTE — Telephone Encounter (Signed)
Please advise. Thank you

## 2018-04-28 NOTE — Telephone Encounter (Signed)
Contacted patient and informed patient that med has been sent in. Pt verbalized understanding.

## 2018-04-28 NOTE — Telephone Encounter (Signed)
Pt needs refill on her amphetamine-dextroamphetamine (ADDERALL XR) 20 MG 24 hr capsule  It will run out this weekend, has follow up appointment scheduled here 05/16/2018     [place]

## 2018-05-01 ENCOUNTER — Other Ambulatory Visit: Payer: Self-pay | Admitting: Family Medicine

## 2018-05-01 ENCOUNTER — Encounter: Payer: Self-pay | Admitting: Family Medicine

## 2018-05-01 ENCOUNTER — Telehealth: Payer: Self-pay | Admitting: Family Medicine

## 2018-05-01 MED ORDER — AMPHETAMINE-DEXTROAMPHET ER 20 MG PO CP24: ORAL_CAPSULE | ORAL | 0 refills | Status: DC

## 2018-05-01 MED FILL — ADDERALL XR 20 MG CAP SA: 20 | 30 days supply | Qty: 30 | Fill #0

## 2018-05-01 NOTE — Telephone Encounter (Signed)
Pt is needing her amphetamine-dextroamphetamine (ADDERALL XR) 20 MG 24 hr capsule transferred from [facility] to [place], [place].  Pt is needing to pick this medication up today

## 2018-05-01 NOTE — Telephone Encounter (Signed)
Checking on refill, requesting medication be sent in by lunch. Advise.

## 2018-05-01 NOTE — Telephone Encounter (Signed)
Duplicate, this was just sent in electronically by myself thank you

## 2018-05-01 NOTE — Telephone Encounter (Signed)
Patient is aware 

## 2018-05-01 NOTE — Telephone Encounter (Signed)
The medication was sent in, a my chart message was sent to the patient

## 2018-05-01 NOTE — Telephone Encounter (Signed)
Please advise 

## 2018-05-11 ENCOUNTER — Other Ambulatory Visit: Payer: Self-pay

## 2018-05-11 ENCOUNTER — Encounter: Payer: Self-pay | Admitting: Women's Health

## 2018-05-11 ENCOUNTER — Ambulatory Visit (INDEPENDENT_AMBULATORY_CARE_PROVIDER_SITE_OTHER): Payer: 59 | Admitting: Women's Health

## 2018-05-11 VITALS — BP 119/78 | HR 76 | Ht 66.0 in

## 2018-05-11 DIAGNOSIS — N920 Excessive and frequent menstruation with regular cycle: Secondary | ICD-10-CM | POA: Diagnosis not present

## 2018-05-11 NOTE — Progress Notes (Signed)
   GYN VISIT Patient name: Hannah [name] MRN [id number]  Date of birth: 08/19/1990 Chief Complaint:   Menorrhagia (passing clots with period)  History of Present Illness:   Hannah [name] is a 28 y.o.  Caucasian female being seen today for report of heavy periods w/ clots.  Periods have become heavy over the past 1-80yrs, normally last 3-4d, heavy 1st day. Last cycle heavy 3d, bled through clothes twice. Changes saturated super tampon q 1hr. Clots about quarter-sized. Cramping on heaviest days. Denies dizziness, lightheadedness, sob, etc. Was on COCs from [age]yo. Trying to conceive, has been having unprotected sex w/ partner x 45yrs, has been tracking ovulation last few months, is ovulating. Partner took steroids from 43yo until 65yo, was told by urologist he would have to take testosterone to help w/ sperm- he has not started this. Not taking pnv.   Patient's last menstrual period was 05/05/2018. The current method of family planning is none. Last pap 09/14/17. Results were:  normal Review of Systems:   Pertinent items are noted in HPI Denies fever/chills, dizziness, headaches, visual disturbances, fatigue, shortness of breath, chest pain, abdominal pain, vomiting, abnormal vaginal discharge/itching/odor/irritation, problems with periods, bowel movements, urination, or intercourse unless otherwise stated above.  Pertinent History Reviewed:  Reviewed past medical,surgical, social, obstetrical and family history.  Reviewed problem list, medications and allergies. Physical Assessment:   Vitals:   05/11/18 1145  BP: 119/78  Pulse: 76  Height: 5\' 6"  (1.676 m)  Body mass index is 20.98 kg/m.       Physical Examination:   General appearance: alert, well appearing, and in no distress  Mental status: alert, oriented to person, place, and time  Skin: warm & dry   Cardiovascular: normal heart rate noted  Respiratory: normal respiratory effort, no distress  Abdomen: soft, non-tender   Pelvic:  VULVA: normal appearing vulva with no masses, tenderness or lesions, VAGINA: normal appearing vagina with normal color and discharge, no lesions, CERVIX: normal appearing cervix without discharge or lesions, UTERUS: uterus is normal size, shape, consistency and nontender  Extremities: no edema   No results found for this or any previous visit (from the past 24 hour(s)).  Assessment & Plan:  1) Menorrhagia w/ regular cycle> will check TSH, CBC. Neg gc/ct and normal pap 09/2017. Declines pelvic u/s. Trying to conceive, so doesn't want to take birth control to lighten flow. Start pnv. Have partner f/u w/ uro/start testosterone to help conceive.  Meds: No orders of the defined types were placed in this encounter.   Orders Placed This Encounter  Procedures  . CBC  . TSH    Return for prn.  Roma Schanz CNM, WHNP-BC 05/11/2018 1:16 PM

## 2018-05-11 NOTE — Patient Instructions (Signed)
If you are trying to get pregnant:   Have sex every other day on days 7-24 of your cycle (Day 1 is the 1st day of your period)  [place] before sex  Lay with your hips elevated on pillows for 20-68mins after sex  Do not smoke or drink alcohol  Lose weight if you are overweight  Take a prenatal vitamin with at least 945WTU of folic acid  Decrease stress in your life  [email or web address]  For Him:   Wear boxers instead of briefs  Avoid hot baths/jacuzzi  Vit C supplement  Do not smoke or drink alcohol  Lose weight if you are overweight

## 2018-05-12 LAB — CBC
HEMOGLOBIN: 13.3 g/dL (ref 11.1–15.9)
Hematocrit: 38.8 % (ref 34.0–46.6)
MCH: 31.1 pg (ref 26.6–33.0)
MCHC: 34.3 g/dL (ref 31.5–35.7)
MCV: 91 fL (ref 79–97)
Platelets: 293 10*3/uL (ref 150–450)
RBC: 4.27 x10E6/uL (ref 3.77–5.28)
RDW: 12 % (ref 11.7–15.4)
WBC: 5.1 10*3/uL (ref 3.4–10.8)

## 2018-05-12 LAB — TSH: TSH: 1.76 u[IU]/mL (ref 0.450–4.500)

## 2018-05-16 ENCOUNTER — Encounter: Payer: 59 | Admitting: Family Medicine

## 2018-05-18 ENCOUNTER — Ambulatory Visit: Payer: 59 | Admitting: Family Medicine

## 2018-05-18 ENCOUNTER — Other Ambulatory Visit: Payer: Self-pay

## 2018-05-18 VITALS — BP 100/70 | Ht 66.0 in | Wt 135.6 lb

## 2018-05-18 DIAGNOSIS — F902 Attention-deficit hyperactivity disorder, combined type: Secondary | ICD-10-CM

## 2018-05-18 MED ORDER — AMPHETAMINE-DEXTROAMPHET ER 20 MG PO CP24: ORAL_CAPSULE | ORAL | 0 refills | Status: DC

## 2018-05-18 MED ORDER — AMPHETAMINE-DEXTROAMPHET ER 20 MG PO CP24: 20.0000 mg | ORAL_CAPSULE | ORAL | 0 refills | Status: DC

## 2018-05-18 NOTE — Progress Notes (Signed)
   Subjective:    Patient ID: Hannah [name] Alert, female    DOB: 1991-01-07, 28 y.o.   MRN: [id number]  HPI Pt here for f/u on her ADD. She is taking adderall xr 20 mg daily. States it is helpful for her. Helps her focus and concentrate at work. Denies any adverse effects. No change in her appetite or sleep habits.    Review of Systems  Constitutional: Negative for unexpected weight change.  Respiratory: Negative for shortness of breath.   Cardiovascular: Negative for chest pain and palpitations.  Gastrointestinal: Negative for abdominal pain.  Psychiatric/Behavioral: Negative for behavioral problems, decreased concentration and sleep disturbance. The patient is not hyperactive.        Objective:   Physical Exam Vitals signs and nursing note reviewed.  Constitutional:      General: She is not in acute distress.    Appearance: She is well-developed.  HENT:     Head: Normocephalic and atraumatic.  Neck:     Musculoskeletal: Neck supple.  Cardiovascular:     Rate and Rhythm: Normal rate and regular rhythm.     Heart sounds: Normal heart sounds. No murmur.  Pulmonary:     Effort: Pulmonary effort is normal. No respiratory distress.     Breath sounds: Normal breath sounds.  Skin:    General: Skin is warm and dry.  Neurological:     Mental Status: She is alert and oriented to person, place, and time.  Psychiatric:        Behavior: Behavior normal.           Assessment & Plan:  Attention deficit hyperactivity disorder (ADHD), combined type  The patient was seen today as part of the visit regarding ADD. Medications were reviewed with the patient as well as compliance. Side effects were checked for. Discussion regarding effectiveness was held. Prescriptions were written. Patient reminded to follow-up in approximately 3 months. Behavioral and study issues were addressed.  Complied with [facility] law, drug registry was checked and verified while present with the patient.   F/u in 3 months.

## 2018-05-29 ENCOUNTER — Telehealth: Payer: Self-pay | Admitting: Family Medicine

## 2018-05-29 NOTE — Telephone Encounter (Signed)
Pt returned call and verbalized understanding  

## 2018-05-29 NOTE — Telephone Encounter (Signed)
3 prescriptions were sent in to [facility] at visit and one may be filled 05/31/2018

## 2018-05-29 NOTE — Telephone Encounter (Signed)
Patient is requesting refill on adderall 20 mg to be call into [facility] out-patient pharmacy. [phone] please review medication for last appointment can see 3 prescriptions on file.

## 2018-05-31 MED FILL — ADDERALL XR 20 MG CAP SA: 20 | 30 days supply | Qty: 30 | Fill #0

## 2018-06-21 ENCOUNTER — Telehealth: Payer: Self-pay | Admitting: Family Medicine

## 2018-06-21 ENCOUNTER — Other Ambulatory Visit: Payer: Self-pay | Admitting: *Deleted

## 2018-06-21 MED ORDER — NORETHINDRONE ACET-ETHINYL EST 1.5-30 MG-MCG PO TABS: 1.0000 | ORAL_TABLET | Freq: Every day | ORAL | 1 refills | Status: DC

## 2018-06-21 MED ORDER — NORETHINDRONE ACET-ETHINYL EST 1.5-30 MG-MCG PO TABS: 1.0000 | ORAL_TABLET | Freq: Every day | ORAL | 3 refills | Status: DC

## 2018-06-21 MED FILL — LARIN 1.5 MG-30 MCG TABLET: 1.5-30 | 84 days supply | Qty: 63 | Fill #0

## 2018-06-21 NOTE — Telephone Encounter (Signed)
Pt returned call and pt verbalized understanding 

## 2018-06-21 NOTE — Telephone Encounter (Signed)
Med sent to pharm. Left message to return call  

## 2018-06-21 NOTE — Telephone Encounter (Signed)
Pt last seen 3/12 for ADD and wellness was 09/14/17 Pt wants to start back on northindrone acetete ethinyl estradiol 15 30 mg mcg tablet. Med is in her med history. States this is the only bp she has taken and it worked well controlling her cycle.

## 2018-06-21 NOTE — Telephone Encounter (Signed)
Pt would like to start talking birth control again.    [place], [place]

## 2018-06-21 NOTE — Telephone Encounter (Signed)
She may have 73-month refill on this

## 2018-06-30 MED FILL — ADDERALL XR 20 MG CAP SA: 20 | 30 days supply | Qty: 30 | Fill #0

## 2018-08-01 MED FILL — ADDERALL XR 20 MG CAP SA: 20 | 30 days supply | Qty: 30 | Fill #0

## 2018-08-31 MED FILL — LARIN 1.5 MG-30 MCG TABLET: 1.5-30 | 84 days supply | Qty: 63 | Fill #1

## 2018-09-14 ENCOUNTER — Ambulatory Visit (INDEPENDENT_AMBULATORY_CARE_PROVIDER_SITE_OTHER): Payer: 59 | Admitting: Family Medicine

## 2018-09-14 ENCOUNTER — Other Ambulatory Visit: Payer: Self-pay

## 2018-09-14 DIAGNOSIS — F902 Attention-deficit hyperactivity disorder, combined type: Secondary | ICD-10-CM | POA: Diagnosis not present

## 2018-09-14 MED ORDER — AMPHETAMINE-DEXTROAMPHET ER 20 MG PO CP24: ORAL_CAPSULE | ORAL | 0 refills | Status: DC

## 2018-09-14 MED ORDER — AMPHETAMINE-DEXTROAMPHET ER 20 MG PO CP24: 20.0000 mg | ORAL_CAPSULE | ORAL | 0 refills | Status: DC

## 2018-09-14 MED FILL — ADDERALL XR 20 MG CAP SA: 20 | 30 days supply | Qty: 30 | Fill #0

## 2018-09-14 NOTE — Progress Notes (Signed)
   Subjective:    Patient ID: Hannah [name], female    DOB: 1990/07/08, 28 y.o.   MRN: [id number]  HPI Patient was seen today for ADD checkup.  This patient does have ADD.  Patient takes medications for this.  If this does help control overall symptoms.  Please see below. -weight, vital signs reviewed.  The following items were covered. 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  HENT: Negative for congestion.   Respiratory: Negative for cough.   Cardiovascular: Negative for chest pain.  Gastrointestinal: Negative for abdominal pain.  Skin: Negative for color change.  Neurological: Negative for headaches.  Psychiatric/Behavioral: Negative for behavioral problems.       Objective:   Physical Exam  Today's visit was via telephone Physical exam was not possible for this visit       Assessment & Plan:  Adult ADD Overall doing well with medicine Continue current medication 3 prescription sent in patient is responsible with her medicines When she gets toward the end of this prescription she will notify us we will send an additional prescription then do follow-up with her follow-up sooner if any problems She states she is only taking the medication on the days that she is working so therefore the prescription could last her more than 30 days

## 2018-10-17 MED FILL — ADDERALL XR 20 MG CAP SA: 20 | 30 days supply | Qty: 30 | Fill #0

## 2018-11-14 MED FILL — ADDERALL XR 20 MG CAP SA: 20 | 30 days supply | Qty: 30 | Fill #0

## 2018-11-21 MED FILL — LARIN 1.5 MG-30 MCG TABLET: 1.5-30 | 84 days supply | Qty: 63 | Fill #2

## 2018-12-11 ENCOUNTER — Encounter: Payer: Self-pay | Admitting: *Deleted

## 2018-12-11 ENCOUNTER — Other Ambulatory Visit: Payer: Self-pay | Admitting: Family Medicine

## 2018-12-11 ENCOUNTER — Telehealth: Payer: Self-pay | Admitting: Family Medicine

## 2018-12-11 MED ORDER — AMPHETAMINE-DEXTROAMPHET ER 20 MG PO CP24: 20.0000 mg | ORAL_CAPSULE | ORAL | 0 refills | Status: DC

## 2018-12-11 NOTE — Telephone Encounter (Signed)
Patient is requesting refill on adderall xr 20 mg called into [facility] out-patient pharmacy .She states only has two pills left has follow up appointment on 10/27 .

## 2018-12-11 NOTE — Telephone Encounter (Signed)
Patient notified via mychart

## 2018-12-11 NOTE — Telephone Encounter (Signed)
Prescription was sent in as requested keep follow-up visit May send her MyChart message regarding this

## 2018-12-13 MED FILL — ADDERALL XR 20 MG CAP SA: 20 | 30 days supply | Qty: 30 | Fill #0

## 2018-12-22 ENCOUNTER — Telehealth: Payer: Self-pay | Admitting: Family Medicine

## 2018-12-22 MED ORDER — FLUCONAZOLE 150 MG PO TABS: ORAL_TABLET | ORAL | 0 refills | Status: DC

## 2018-12-22 MED FILL — FLUCONAZOLE 150 MG TABS: 150 | 3 days supply | Qty: 2 | Fill #0

## 2018-12-22 NOTE — Telephone Encounter (Signed)
Pt returned call. Pt having itching and discharge. No pain, no fever. Diflucan sent in per protocol. Pt verbalized understanding.

## 2018-12-22 NOTE — Telephone Encounter (Signed)
Patient has a yeast infection and would like to get something called in to.   [name]

## 2018-12-22 NOTE — Telephone Encounter (Signed)
[facility]

## 2019-01-02 ENCOUNTER — Other Ambulatory Visit: Payer: Self-pay

## 2019-01-02 ENCOUNTER — Ambulatory Visit (INDEPENDENT_AMBULATORY_CARE_PROVIDER_SITE_OTHER): Payer: 59 | Admitting: Family Medicine

## 2019-01-02 DIAGNOSIS — F902 Attention-deficit hyperactivity disorder, combined type: Secondary | ICD-10-CM | POA: Diagnosis not present

## 2019-01-02 MED ORDER — AMPHETAMINE-DEXTROAMPHET ER 20 MG PO CP24: 20.0000 mg | ORAL_CAPSULE | ORAL | 0 refills | Status: DC

## 2019-01-02 MED ORDER — AMPHETAMINE-DEXTROAMPHET ER 20 MG PO CP24: ORAL_CAPSULE | ORAL | 0 refills | Status: DC

## 2019-01-02 NOTE — Addendum Note (Signed)
Addended by: Carmelina Noun on: 01/02/2019 11:42 AM   Modules accepted: Orders

## 2019-01-02 NOTE — Progress Notes (Signed)
   Subjective:    Patient ID: Hannah [name], female    DOB: Jul 07, 1990, 28 y.o.   MRN: [id number]  HPIADD check up. Takes Adderall xr 20mg  one daily. Pt states no concerns today.  Adult ADD Takes medication helps her focus Denies abusing the medicine States without the medication she would have a hard time focusing and following through.  States she is able to eat and drink okay not having any headaches Virtual Visit via Telephone Note  I connected with Hannah [name] on 01/02/19 at  8:30 AM EDT by telephone and verified that I am speaking with the correct person using two identifiers.  Location: Patient: home Provider: office   I discussed the limitations, risks, security and privacy concerns of performing an evaluation and management service by telephone and the availability of in person appointments. I also discussed with the patient that there may be a patient responsible charge related to this service. The patient expressed understanding and agreed to proceed.   History of Present Illness:    Observations/Objective:   Assessment and Plan:   Follow Up Instructions:    I discussed the assessment and treatment plan with the patient. The patient was provided an opportunity to ask questions and all were answered. The patient agreed with the plan and demonstrated an understanding of the instructions.   The patient was advised to call back or seek an in-person evaluation if the symptoms worsen or if the condition fails to improve as anticipated.  I provided 15 minutes of non-face-to-face time during this encounter.       Review of Systems  Constitutional: Negative for activity change, appetite change and fatigue.  HENT: Negative for congestion.   Respiratory: Negative for cough.   Cardiovascular: Negative for chest pain.  Gastrointestinal: Negative for abdominal pain.  Skin: Negative for color change.  Neurological: Negative for headaches.  Psychiatric/Behavioral:  Negative for behavioral problems.       Objective:   Physical Exam  Today's visit was via telephone Physical exam was not possible for this visit   15 minutes was spent with patient today discussing healthcare issues which they came.  More than 50% of this visit-total duration of visit-was spent in counseling and coordination of care.  Please see diagnosis regarding the focus of this coordination and care     Assessment & Plan:  Adult ADD Overall doing well on medication Does help her focus She will be seeing a counselor because of having to work through some issues from her recent dating but she denies being depressed She does not feel she needs to be on any medication for this We went ahead and sent in 3 scripts on her ADD medicine she will connect with [place] we will send in an additional prescription at that time and she will follow-up in 4 months sooner problems

## 2019-01-02 NOTE — Addendum Note (Signed)
Addended by: Sallee Lange A on: 01/02/2019 01:25 PM   Modules accepted: Orders

## 2019-01-10 MED FILL — ADDERALL XR 20 MG CAP SA: 20 | 30 days supply | Qty: 30 | Fill #0

## 2019-01-15 MED FILL — METRONIDAZOLE 500 MG TABS: 500 | 7 days supply | Qty: 14 | Fill #0

## 2019-01-26 ENCOUNTER — Ambulatory Visit: Payer: 59 | Admitting: Nurse Practitioner

## 2019-02-05 MED FILL — LARIN 1.5 MG-30 MCG TABLET: 1.5-30 | 84 days supply | Qty: 63 | Fill #3

## 2019-02-09 ENCOUNTER — Encounter: Payer: Self-pay | Admitting: Nurse Practitioner

## 2019-02-09 ENCOUNTER — Other Ambulatory Visit: Payer: Self-pay

## 2019-02-09 ENCOUNTER — Ambulatory Visit (INDEPENDENT_AMBULATORY_CARE_PROVIDER_SITE_OTHER): Payer: 59 | Admitting: Nurse Practitioner

## 2019-02-09 VITALS — BP 112/72 | Temp 98.3°F | Wt 130.2 lb

## 2019-02-09 DIAGNOSIS — N898 Other specified noninflammatory disorders of vagina: Secondary | ICD-10-CM | POA: Diagnosis not present

## 2019-02-09 MED FILL — ADDERALL XR 20 MG CAP SA: 20 | 30 days supply | Qty: 30 | Fill #0

## 2019-02-09 NOTE — Progress Notes (Signed)
   Subjective:    Patient ID: Hannah [name], female    DOB: 08/31/1990, 28 y.o.   MRN: [id number]  HPI Patient arrives to discuss recurrent yeast infections. Patient has tried probiotics which have helped some. Has had vaginal discharge for about 4 months. Describes as thin white discharge, no odor. No GU burning. Rare itching. No urinary symptoms. No fever or pelvic pain. No dyspareunia. Has been with her current partner for about 4 months. Does not use condoms. No missed oc's. Will occasionally not have a cycle on her current pill. Has taken urine pregnancy tests at work which were all negative.   Review of Systems     Objective:   Physical Exam NAD. [name], oriented. Lungs clear. Heart RRR. External GU: no rashes or lesions. Vagina: minimal thin white discharge. Cervix normal in appearance. No CMT. Bimanual exam: no tenderness or masses.  Wet prep: ph 4.5 with multiple epis.        Assessment & Plan:  Vaginal discharge  Continue probiotics. Defers STI testing at this time. Explained that discharge could be related to hormones but no abnormalities were noted.  Recheck here if further problems.

## 2019-03-14 MED FILL — ADDERALL XR 20 MG CAP SA: 20 | 30 days supply | Qty: 30 | Fill #0

## 2019-04-04 ENCOUNTER — Ambulatory Visit (INDEPENDENT_AMBULATORY_CARE_PROVIDER_SITE_OTHER): Payer: 59 | Admitting: Family Medicine

## 2019-04-04 ENCOUNTER — Other Ambulatory Visit: Payer: Self-pay

## 2019-04-04 DIAGNOSIS — F902 Attention-deficit hyperactivity disorder, combined type: Secondary | ICD-10-CM

## 2019-04-04 MED ORDER — AMPHETAMINE-DEXTROAMPHET ER 20 MG PO CP24: 20.0000 mg | ORAL_CAPSULE | ORAL | 0 refills | Status: DC

## 2019-04-04 MED ORDER — AMPHETAMINE-DEXTROAMPHET ER 20 MG PO CP24: ORAL_CAPSULE | ORAL | 0 refills | Status: DC
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
## 2019-04-13 MED FILL — ADDERALL XR 20 MG CAP SA: 20 | 30 days supply | Qty: 30 | Fill #0

## 2019-05-07 ENCOUNTER — Other Ambulatory Visit: Payer: Self-pay | Admitting: Family Medicine

## 2019-05-07 NOTE — Telephone Encounter (Signed)
Please contact patient to set up physical; then may route back to nurses. Thank you

## 2019-05-07 NOTE — Telephone Encounter (Signed)
lvm to schedule physical

## 2019-05-08 NOTE — Telephone Encounter (Signed)
lvm to schedule physical

## 2019-05-09 NOTE — Telephone Encounter (Signed)
Pt will call back when she has her work schedule.

## 2019-05-10 MED FILL — LARIN 1.5 MG-30 MCG TABLET: 1.5-30 | 63 days supply | Qty: 63 | Fill #0

## 2019-05-10 NOTE — Telephone Encounter (Signed)
Pt is scheduled for CPE with [facility] 5/14

## 2019-05-11 MED FILL — ADDERALL XR 20 MG CAP SA: 20 | 30 days supply | Qty: 30 | Fill #0

## 2019-06-13 MED FILL — ADDERALL XR 20 MG CAP SA: 20 | 30 days supply | Qty: 30 | Fill #0

## 2019-07-04 DIAGNOSIS — S61201A Unspecified open wound of left index finger without damage to nail, initial encounter: Secondary | ICD-10-CM | POA: Diagnosis not present

## 2019-07-11 ENCOUNTER — Other Ambulatory Visit: Payer: Self-pay | Admitting: Family Medicine

## 2019-07-11 MED FILL — ADDERALL XR 20 MG CAP SA: 20 | 30 days supply | Qty: 30 | Fill #0

## 2019-07-20 ENCOUNTER — Encounter: Payer: 59 | Admitting: Nurse Practitioner

## 2019-07-23 ENCOUNTER — Other Ambulatory Visit: Payer: Self-pay | Admitting: Family Medicine

## 2019-07-23 MED FILL — LARIN 1.5 MG-30 MCG TABLET: 1.5-30 | 84 days supply | Qty: 63 | Fill #0

## 2019-08-10 ENCOUNTER — Other Ambulatory Visit: Payer: Self-pay | Admitting: Family Medicine

## 2019-08-10 ENCOUNTER — Telehealth: Payer: Self-pay | Admitting: Family Medicine

## 2019-08-10 ENCOUNTER — Encounter: Payer: Self-pay | Admitting: Family Medicine

## 2019-08-10 MED ORDER — AMPHETAMINE-DEXTROAMPHET ER 20 MG PO CP24: 20.0000 mg | ORAL_CAPSULE | ORAL | 0 refills | Status: DC

## 2019-08-10 NOTE — Telephone Encounter (Signed)
Please pend

## 2019-08-10 NOTE — Telephone Encounter (Signed)
Med pended

## 2019-08-10 NOTE — Telephone Encounter (Signed)
Patient has appointment for ADD follow up on 08/30/2019 and requesting refill on Adderall XR 20 mg enough to get her through her appointment.  Anza

## 2019-08-11 MED FILL — ADDERALL XR 20 MG CAP SA: 20 | 30 days supply | Qty: 30 | Fill #0

## 2019-08-30 ENCOUNTER — Other Ambulatory Visit: Payer: Self-pay

## 2019-08-30 ENCOUNTER — Encounter: Payer: Self-pay | Admitting: Family Medicine

## 2019-08-30 ENCOUNTER — Ambulatory Visit (INDEPENDENT_AMBULATORY_CARE_PROVIDER_SITE_OTHER): Payer: 59 | Admitting: Family Medicine

## 2019-08-30 VITALS — BP 114/72 | HR 100 | Temp 97.6°F | Ht 66.0 in | Wt 127.0 lb

## 2019-08-30 DIAGNOSIS — F902 Attention-deficit hyperactivity disorder, combined type: Secondary | ICD-10-CM

## 2019-08-30 MED ORDER — AMPHETAMINE-DEXTROAMPHET ER 20 MG PO CP24: 20.0000 mg | ORAL_CAPSULE | ORAL | 0 refills | Status: DC

## 2019-08-30 MED ORDER — AMPHETAMINE-DEXTROAMPHET ER 20 MG PO CP24: ORAL_CAPSULE | ORAL | 0 refills | Status: DC

## 2019-08-30 NOTE — Progress Notes (Signed)
   Subjective:    Patient ID: Hannah Hannah [name], female    DOB: October 13, 1990, 29 y.o.   MRN: [id number]  HPIADD check up. Takes adderall xr 20mg . States no concerns or problems today.  ADD medicine helps her Allows her to stay more focused She does not abuse it    Review of Systems  Constitutional: Negative for activity change, appetite change and fatigue.  HENT: Negative for congestion and rhinorrhea.   Respiratory: Negative for cough and shortness of breath.   Cardiovascular: Negative for chest pain and leg swelling.  Gastrointestinal: Negative for abdominal pain and diarrhea.  Endocrine: Negative for polydipsia and polyphagia.  Skin: Negative for color change.  Neurological: Negative for dizziness and weakness.  Psychiatric/Behavioral: Negative for behavioral problems and confusion.       Objective:   Physical Exam Vitals reviewed.  Constitutional:      Appearance: She is well-developed.  HENT:     Head: Normocephalic.  Cardiovascular:     Rate and Rhythm: Normal rate and regular rhythm.     Heart sounds: Normal heart sounds. No murmur heard.   Pulmonary:     Effort: Pulmonary effort is normal.     Breath sounds: Normal breath sounds.  Skin:    General: Skin is warm and dry.  Neurological:     Mental Status: She is Hannah [name].    She is under a lot of stress at work but otherwise she handles things well Denies being depressed      Assessment & Plan:  ADD Renew meds Follow-up in several months Continue medication as discussed Patient stress levels are manageable.

## 2019-09-07 ENCOUNTER — Encounter: Payer: Self-pay | Admitting: Nurse Practitioner

## 2019-09-07 ENCOUNTER — Ambulatory Visit (INDEPENDENT_AMBULATORY_CARE_PROVIDER_SITE_OTHER): Payer: 59 | Admitting: Nurse Practitioner

## 2019-09-07 ENCOUNTER — Other Ambulatory Visit: Payer: Self-pay

## 2019-09-07 VITALS — BP 122/74 | Temp 98.4°F | Ht 67.0 in | Wt 129.8 lb

## 2019-09-07 DIAGNOSIS — Z01419 Encounter for gynecological examination (general) (routine) without abnormal findings: Secondary | ICD-10-CM | POA: Diagnosis not present

## 2019-09-07 NOTE — Progress Notes (Signed)
Subjective:    Patient ID: Hannah [name], female    DOB: 1990/06/12, 29 y.o.   MRN: [id number]  HPI presents for wellness exam.   29 year old female presents for wellness exam.  Does not have period on the off week of BCP, usually has on first week of new pill pack. Periods not heavy or painful.  Lives alone.  Does not smoke cigarettes, use illicit drugs. Will have occasional glass wine or liquor drink with dinner.  Has not had sex in over 1 year, since breaking up with BF.  Takes Adderall at work, does not take med when not at work.   Had recent sinus infection, still has mild cough, productive in the morning- yellow tinged phlegm.  Did not take any antibiotics.    Exercise:  Lifts weights 4 x week x 1 hour.  Diet:  Eats healthy.  Regular dental care.   Review of Systems  Constitutional: Negative for activity change, appetite change, fatigue and fever.  HENT: Negative for congestion, ear pain, postnasal drip, sinus pressure, sinus pain, sore throat and trouble swallowing.   Respiratory: Positive for cough. Negative for chest tightness, shortness of breath and wheezing.   Cardiovascular: Negative for chest pain and leg swelling.  Gastrointestinal: Negative for abdominal pain, constipation, diarrhea, nausea and vomiting.  Genitourinary: Positive for menstrual problem. Negative for dysuria, frequency, hematuria, pelvic pain, urgency and vaginal discharge.  Skin: Negative for rash.  Psychiatric/Behavioral: Negative for sleep disturbance and suicidal ideas. The patient is not nervous/anxious.     Depression screen [facility] 2/9 09/07/2019 03/31/2017  Decreased Interest 0 0  Down, Depressed, Hopeless 0 0  PHQ - 2 Score 0 0  Altered sleeping - 0  Tired, decreased energy - 0  Change in appetite - 0  Feeling bad or failure about yourself  - 0  Trouble concentrating - 0  Moving slowly or fidgety/restless - 0  Suicidal thoughts - 0  PHQ-9 Score - 0        Objective:   Physical  Exam Vitals reviewed.  Constitutional:      General: She is not in acute distress.    Appearance: Normal appearance. She is normal weight.  Neck:     Comments: Thyroid non tender. No mass or goiter noted.  Cardiovascular:     Rate and Rhythm: Normal rate and regular rhythm.     Pulses: Normal pulses.     Heart sounds: Normal heart sounds. No murmur heard.   Pulmonary:     Effort: Pulmonary effort is normal.     Breath sounds: Normal breath sounds.  Chest:     Breasts:        Right: No inverted nipple, mass, skin change or tenderness.        Left: No inverted nipple, mass, skin change or tenderness.     Comments: Exam limited due to implants. No mass or nodules noted in the tissue surrounding implants.  Abdominal:     General: Abdomen is flat. There is no distension.     Palpations: There is no mass.     Tenderness: There is no abdominal tenderness.  Genitourinary:    Comments: Deferred GU exam.  No c/o vaginal discharge, vaginal/pelvic pain, or lesions.   Musculoskeletal:     Cervical back: Normal range of motion.  Lymphadenopathy:     Cervical: No cervical adenopathy.     Upper Body:     Right upper body: No supraclavicular, axillary or pectoral adenopathy.  Left upper body: No supraclavicular, axillary or pectoral adenopathy.  Neurological:     Mental Status: She is [name] and oriented to person, place, and time.  Psychiatric:        Mood and Affect: Mood normal.        Behavior: Behavior normal.        Thought Content: Thought content normal.        Judgment: Judgment normal.    Today's Vitals   09/07/19 1457  BP: 122/74  Temp: 98.4 F (36.9 C)  TempSrc: Oral  Weight: 58.9 kg  Height: 5\' 7"  (1.702 m)   Body mass index is 20.33 kg/m.        Assessment & Plan:  Well woman exam   BCP:  Discussed options for a different birth control pill since current one is causing period during time that is not off week according to pill pack.  Defers changing at  this time. Will contact office if she wants to switch.    Labs:  Lab work to be drawn at next year annual exam.  Continue healthy lifestyle habits. Discussed safe sex issues.  Return in about 1 year (around 09/06/2020) for physical.

## 2019-09-07 NOTE — Progress Notes (Signed)
   Subjective:    Patient ID: Hannah [name], female    DOB: Aug 07, 1990, 29 y.o.   MRN: [id number]  HPI  The patient comes in today for a wellness visit.    A review of their health history was completed.  A review of medications was also completed.  Any needed refills; birth control  Eating habits: good  Falls/  MVA accidents in past few months: none  Regular exercise: lifts weight 4 times a week   Preventative health issues were discussed.   Additional concerns: none   Review of Systems     Objective:   Physical Exam        Assessment & Plan:

## 2019-09-11 ENCOUNTER — Encounter: Payer: Self-pay | Admitting: Nurse Practitioner

## 2019-09-17 ENCOUNTER — Other Ambulatory Visit: Payer: Self-pay | Admitting: Nurse Practitioner

## 2019-09-17 MED FILL — ADDERALL XR 20 MG CAP SA: 20 | 30 days supply | Qty: 30 | Fill #0

## 2019-09-20 ENCOUNTER — Other Ambulatory Visit: Payer: Self-pay | Admitting: Nurse Practitioner

## 2019-10-03 MED FILL — LARIN 1.5 MG-30 MCG TABLET: 1.5-30 | 84 days supply | Qty: 63 | Fill #0

## 2019-10-29 MED FILL — ADDERALL XR 20 MG CAP SA: 20 | 30 days supply | Qty: 30 | Fill #0

## 2019-12-03 MED FILL — ADDERALL XR 20 MG CAP SA: 20 | 30 days supply | Qty: 30 | Fill #0

## 2019-12-26 MED FILL — LARIN 1.5 MG-30 MCG TABLET: 1.5-30 | 84 days supply | Qty: 63 | Fill #1

## 2020-01-01 ENCOUNTER — Other Ambulatory Visit: Payer: Self-pay | Admitting: Family Medicine

## 2020-01-01 ENCOUNTER — Telehealth: Payer: Self-pay

## 2020-01-01 MED ORDER — AMPHETAMINE-DEXTROAMPHET ER 20 MG PO CP24
20.0000 mg | ORAL_CAPSULE | ORAL | 0 refills | Status: DC
Start: 2020-01-01 — End: 2020-02-05

## 2020-01-01 NOTE — Telephone Encounter (Signed)
Pt needs refill on amphetamine-dextroamphetamine (ADDERALL XR) 20 MG 24 hr capsule [[id number]] [place], [place] follow up [phone]

## 2020-01-01 NOTE — Telephone Encounter (Signed)
Prescription sent in as requested please keep follow-up visit

## 2020-01-02 MED FILL — ADDERALL XR 20 MG CAP SA: 20 | 30 days supply | Qty: 30 | Fill #0

## 2020-01-02 NOTE — Telephone Encounter (Signed)
Pt.notified

## 2020-01-15 ENCOUNTER — Encounter: Payer: Self-pay | Admitting: Family Medicine

## 2020-01-15 ENCOUNTER — Ambulatory Visit: Payer: 59 | Admitting: Family Medicine

## 2020-01-15 ENCOUNTER — Other Ambulatory Visit: Payer: Self-pay

## 2020-01-15 VITALS — BP 118/76 | HR 111 | Temp 98.2°F | Wt 134.2 lb

## 2020-01-15 DIAGNOSIS — Z23 Encounter for immunization: Secondary | ICD-10-CM

## 2020-01-15 DIAGNOSIS — F902 Attention-deficit hyperactivity disorder, combined type: Secondary | ICD-10-CM

## 2020-01-15 NOTE — Progress Notes (Signed)
   Subjective:    Patient ID: Hannah [name] Alert, female    DOB: [birth year], 29 y.o.   MRN: [id number]  HPI Patient was seen today for ADD checkup.  This patient does have ADD.  Patient takes medications for this.  If this does help control overall symptoms.  Please see below. -weight, vital signs reviewed.  The following items were covered. 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  HENT: Negative for congestion.   Respiratory: Negative for cough.   Cardiovascular: Negative for chest pain.  Gastrointestinal: Negative for abdominal pain.  Skin: Negative for color change.  Neurological: Negative for headaches.  Psychiatric/Behavioral: Negative for behavioral problems.        Objective:   Physical Exam Vitals reviewed.  Constitutional:      Appearance: She is well-developed.  HENT:     Head: Normocephalic.  Cardiovascular:     Rate and Rhythm: Normal rate and regular rhythm.     Heart sounds: Normal heart sounds. No murmur heard.   Pulmonary:     Effort: Pulmonary effort is normal.     Breath sounds: Normal breath sounds.  Skin:    General: Skin is warm and dry.  Neurological:     Mental Status: She is alert.           Assessment & Plan:  Adult ADD Doing well overall Continue current measures Follow-up if ongoing troubles or problems Follow-up again in approximately 4 months.  Refills were sent in follow-up if any issues

## 2020-01-17 NOTE — Addendum Note (Signed)
Addended by: Vicente Males on: 01/17/2020 08:44 AM   Modules accepted: Orders

## 2020-02-05 ENCOUNTER — Other Ambulatory Visit: Payer: Self-pay

## 2020-02-05 ENCOUNTER — Other Ambulatory Visit: Payer: Self-pay | Admitting: Family Medicine

## 2020-02-05 MED ORDER — AMPHETAMINE-DEXTROAMPHET ER 20 MG PO CP24
20.0000 mg | ORAL_CAPSULE | ORAL | 0 refills | Status: DC
Start: 2020-02-05 — End: 2020-05-07

## 2020-02-05 MED ORDER — AMPHETAMINE-DEXTROAMPHET ER 20 MG PO CP24: ORAL_CAPSULE | ORAL | 0 refills | Status: DC

## 2020-02-05 MED FILL — ADDERALL XR 20 MG CAP SA: 20 | 30 days supply | Qty: 30 | Fill #0

## 2020-02-05 NOTE — Telephone Encounter (Signed)
Pt is calling needing a refill on amphetamine-dextroamphetamine (ADDERALL XR) 20 MG 24 hr capsule [[id number]] [facility] Pt works at [employer] back [phone]

## 2020-02-05 NOTE — Telephone Encounter (Signed)
If possible please pend 3 prescription for [facility] Same strength, #30, 02/05/2020, 03/05/2020, 04/04/2020

## 2020-02-05 NOTE — Telephone Encounter (Signed)
Medication pended. Left message to return call 

## 2020-02-05 NOTE — Telephone Encounter (Signed)
Prescriptions were signed thank you

## 2020-02-05 NOTE — Telephone Encounter (Signed)
[facility] pended for [facility] if you agree to switch pharm

## 2020-02-06 NOTE — Telephone Encounter (Signed)
Patient notified

## 2020-03-07 MED FILL — ADDERALL XR 20 MG CAP SA: 20 | 30 days supply | Qty: 30 | Fill #0

## 2020-03-17 MED FILL — LARIN 1.5 MG-30 MCG TABLET: 1.5-30 | 84 days supply | Qty: 63 | Fill #0

## 2020-04-07 MED FILL — ADDERALL XR 20 MG CAP SA: 20 | 30 days supply | Qty: 30 | Fill #0

## 2020-04-11 ENCOUNTER — Encounter: Payer: Self-pay | Admitting: Nurse Practitioner

## 2020-04-11 ENCOUNTER — Telehealth: Payer: Self-pay

## 2020-04-11 NOTE — Telephone Encounter (Signed)
t has Phy 09/2019 and new job needs letter stating that she is no work restrictions and free of communicable diseases  Call back [phone]

## 2020-04-12 ENCOUNTER — Encounter: Payer: Self-pay | Admitting: Nurse Practitioner

## 2020-04-12 NOTE — Telephone Encounter (Signed)
Message sent through my chart

## 2020-04-15 ENCOUNTER — Encounter: Payer: Self-pay | Admitting: Nurse Practitioner

## 2020-04-15 NOTE — Telephone Encounter (Signed)
Hannah [name] called and said that she needed stated on her paperwork that she is free of communicable diseases and no work restriction in order for her to start this job  Pt call back [phone]

## 2020-04-15 NOTE — Telephone Encounter (Signed)
I sent a second revision of the letter. Does she need another? It has been posted in my chart.

## 2020-04-15 NOTE — Telephone Encounter (Signed)
See my chart message to patient from [place]

## 2020-04-29 ENCOUNTER — Other Ambulatory Visit: Payer: 59

## 2020-05-07 ENCOUNTER — Other Ambulatory Visit: Payer: Self-pay | Admitting: Family Medicine

## 2020-05-07 ENCOUNTER — Ambulatory Visit: Payer: 59 | Admitting: Family Medicine

## 2020-05-07 ENCOUNTER — Other Ambulatory Visit: Payer: Self-pay

## 2020-05-07 ENCOUNTER — Encounter: Payer: Self-pay | Admitting: Family Medicine

## 2020-05-07 VITALS — BP 125/83 | HR 96 | Temp 97.0°F | Ht 67.0 in | Wt 126.6 lb

## 2020-05-07 DIAGNOSIS — F902 Attention-deficit hyperactivity disorder, combined type: Secondary | ICD-10-CM

## 2020-05-07 MED ORDER — AMPHETAMINE-DEXTROAMPHET ER 20 MG PO CP24: ORAL_CAPSULE | ORAL | 0 refills | Status: DC

## 2020-05-07 MED ORDER — AMPHETAMINE-DEXTROAMPHET ER 20 MG PO CP24: 20.0000 mg | ORAL_CAPSULE | ORAL | 0 refills | Status: AC

## 2020-05-07 MED FILL — ADDERALL XR 20 MG CAP SA: 20 | 30 days supply | Qty: 30 | Fill #0

## 2020-05-07 NOTE — Progress Notes (Signed)
Patient ID: Hannah [name], female    DOB: 1990-06-19, 30 y.o.   MRN: [id number]   Chief Complaint  Patient presents with  . ADHD    Follow up   Subjective:  CC: follow-up for ADHD    Patient doing well on current meds This is not a new problem.  Presents today for follow-up for ADHD medications.  Reports that she is tolerating the medications well, eating well, sleeping well, helps her with focus.  She is an avid exerciser and eats very healthy, working on her personal trainer exam currently.  She works 3 jobs.  Reports that her weight is usually between 125 to 135 pounds.      Medical History Hannah [name] has no past medical history on file.   Outpatient Encounter Medications as of 05/07/2020  Medication Sig  . amphetamine-dextroamphetamine (ADDERALL XR) 20 MG 24 hr capsule Take one capsule po every morning  . amphetamine-dextroamphetamine (ADDERALL XR) 20 MG 24 hr capsule Take 1 capsule (20 mg total) by mouth every morning.  Marland Kitchen amphetamine-dextroamphetamine (ADDERALL XR) 20 MG 24 hr capsule Take one capsule po every morning  . BIOTIN PO Take by mouth daily.  . Multiple Vitamin (MULTIVITAMIN) tablet Take 1 tablet by mouth daily.  . [DISCONTINUED] amphetamine-dextroamphetamine (ADDERALL XR) 20 MG 24 hr capsule Take 1 capsule (20 mg total) by mouth every morning.  . [DISCONTINUED] amphetamine-dextroamphetamine (ADDERALL XR) 20 MG 24 hr capsule Take one capsule po every morning  . [DISCONTINUED] amphetamine-dextroamphetamine (ADDERALL XR) 20 MG 24 hr capsule Take one capsule po every morning  . [DISCONTINUED] LARIN 1.5/30 1.5-30 MG-MCG tablet TAKE 1 TABLET BY MOUTH ONCE DAILY. START 1ST SUNDAY AFTER NEXT PERIOD BEGINS   No facility-administered encounter medications on file as of 05/07/2020.     Review of Systems  Constitutional: Negative for appetite change, chills, fatigue and fever.  Respiratory: Negative for shortness of breath.   Cardiovascular: Negative for chest pain.   Gastrointestinal: Negative for abdominal pain.  Neurological: Negative for dizziness, light-headedness and headaches.  Psychiatric/Behavioral: Negative for sleep disturbance.     Vitals BP 125/83   Pulse 96   Temp (!) 97 F (36.1 C) (Oral)   Ht 5\' 7"  (1.702 m)   Wt 126 lb 9.6 oz (57.4 kg)   SpO2 98%   BMI 19.83 kg/m   Objective:   Physical Exam Vitals reviewed.  Constitutional:      Appearance: Normal appearance.  Cardiovascular:     Rate and Rhythm: Normal rate and regular rhythm.     Heart sounds: Normal heart sounds.  Pulmonary:     Effort: Pulmonary effort is normal.     Breath sounds: Normal breath sounds.  Skin:    General: Skin is warm and dry.  Neurological:     General: No focal deficit present.     Mental Status: She is alert.  Psychiatric:        Behavior: Behavior normal.      Assessment and Plan   1. Attention deficit hyperactivity disorder (ADHD), combined type - amphetamine-dextroamphetamine (ADDERALL XR) 20 MG 24 hr capsule; Take one capsule po every morning  Dispense: 30 capsule; Refill: 0 - amphetamine-dextroamphetamine (ADDERALL XR) 20 MG 24 hr capsule; Take 1 capsule (20 mg total) by mouth every morning.  Dispense: 30 capsule; Refill: 0 - amphetamine-dextroamphetamine (ADDERALL XR) 20 MG 24 hr capsule; Take one capsule po every morning  Dispense: 30 capsule; Refill: 0   Will continue current medication regimen, refill sent.  Doing  well, reports that on days she is not working, she will not take the medication.  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  She does not wish to be on this medication long-term.  Agrees with plan of care discussed today. Understands warning signs to seek further care: chest pain, shortness of breath, any significant change in health.  Understands to follow-up in 3 months for medication management for ADHD, sooner if anything changes.  Hannah Ades,  NP 05/07/20

## 2020-05-07 NOTE — Patient Instructions (Signed)

## 2020-06-06 MED FILL — ADDERALL XR 20 MG CAP SA: 20 | 30 days supply | Qty: 30 | Fill #0

## 2024-02-06 ENCOUNTER — Other Ambulatory Visit (HOSPITAL_COMMUNITY): Payer: Self-pay
